# Patient Record
Sex: Female | Born: 1945 | Race: Black or African American | Hispanic: No | Marital: Single | Smoking: Never smoker
Health system: Southern US, Community
[De-identification: ages and names within clinical notes are randomized; demographics above are authoritative.]

## PROBLEM LIST (undated history)

## (undated) DIAGNOSIS — F32A Depression, unspecified: Secondary | ICD-10-CM

## (undated) DIAGNOSIS — C539 Malignant neoplasm of cervix uteri, unspecified: Secondary | ICD-10-CM

## (undated) DIAGNOSIS — I10 Essential (primary) hypertension: Secondary | ICD-10-CM

## (undated) DIAGNOSIS — E785 Hyperlipidemia, unspecified: Secondary | ICD-10-CM

## (undated) DIAGNOSIS — Z96651 Presence of right artificial knee joint: Secondary | ICD-10-CM

## (undated) DIAGNOSIS — C801 Malignant (primary) neoplasm, unspecified: Secondary | ICD-10-CM

## (undated) DIAGNOSIS — Z8541 Personal history of malignant neoplasm of cervix uteri: Secondary | ICD-10-CM

## (undated) DIAGNOSIS — F329 Major depressive disorder, single episode, unspecified: Secondary | ICD-10-CM

## (undated) DIAGNOSIS — M1990 Unspecified osteoarthritis, unspecified site: Secondary | ICD-10-CM

## (undated) DIAGNOSIS — M8580 Other specified disorders of bone density and structure, unspecified site: Secondary | ICD-10-CM

## (undated) DIAGNOSIS — E669 Obesity, unspecified: Secondary | ICD-10-CM

## (undated) DIAGNOSIS — Z96652 Presence of left artificial knee joint: Secondary | ICD-10-CM

## (undated) HISTORY — DX: Other specified disorders of bone density and structure, unspecified site: M85.80

## (undated) HISTORY — DX: Hyperlipidemia, unspecified: E78.5

## (undated) HISTORY — DX: Obesity, unspecified: E66.9

## (undated) HISTORY — DX: Personal history of malignant neoplasm of cervix uteri: Z85.41

## (undated) HISTORY — DX: Essential (primary) hypertension: I10

---
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
## 1996-07-04 HISTORY — PX: ABDOMINAL HYSTERECTOMY: SHX81

## 1996-07-04 HISTORY — PX: EYE SURGERY: SHX253

## 2003-12-29 ENCOUNTER — Emergency Department (HOSPITAL_COMMUNITY): Admission: EM | Admit: 2003-12-29 | Discharge: 2003-12-29 | Payer: Self-pay | Admitting: Family Medicine

## 2004-04-19 ENCOUNTER — Ambulatory Visit: Payer: Self-pay | Admitting: Internal Medicine

## 2004-05-03 ENCOUNTER — Ambulatory Visit: Payer: Self-pay | Admitting: Internal Medicine

## 2004-05-18 ENCOUNTER — Ambulatory Visit: Payer: Self-pay | Admitting: Internal Medicine

## 2004-06-01 ENCOUNTER — Ambulatory Visit: Payer: Self-pay | Admitting: Internal Medicine

## 2004-06-08 ENCOUNTER — Ambulatory Visit: Payer: Self-pay | Admitting: Internal Medicine

## 2004-06-22 ENCOUNTER — Ambulatory Visit: Payer: Self-pay | Admitting: Internal Medicine

## 2004-07-20 ENCOUNTER — Ambulatory Visit: Payer: Self-pay | Admitting: Internal Medicine

## 2004-07-26 ENCOUNTER — Ambulatory Visit: Payer: Self-pay | Admitting: Internal Medicine

## 2004-08-31 ENCOUNTER — Ambulatory Visit: Payer: Self-pay | Admitting: Internal Medicine

## 2004-09-07 ENCOUNTER — Ambulatory Visit: Payer: Self-pay | Admitting: Internal Medicine

## 2004-09-28 ENCOUNTER — Ambulatory Visit: Payer: Self-pay | Admitting: Internal Medicine

## 2004-11-02 ENCOUNTER — Ambulatory Visit: Payer: Self-pay | Admitting: Internal Medicine

## 2004-11-30 ENCOUNTER — Encounter: Admission: RE | Admit: 2004-11-30 | Discharge: 2004-11-30 | Payer: Self-pay | Admitting: Internal Medicine

## 2005-08-18 ENCOUNTER — Ambulatory Visit: Payer: Self-pay | Admitting: Internal Medicine

## 2005-08-29 ENCOUNTER — Ambulatory Visit: Payer: Self-pay | Admitting: Internal Medicine

## 2005-09-06 ENCOUNTER — Ambulatory Visit: Payer: Self-pay | Admitting: Internal Medicine

## 2005-09-27 ENCOUNTER — Ambulatory Visit: Payer: Self-pay | Admitting: Internal Medicine

## 2005-10-25 ENCOUNTER — Ambulatory Visit: Payer: Self-pay | Admitting: Internal Medicine

## 2006-01-13 ENCOUNTER — Ambulatory Visit: Payer: Self-pay | Admitting: Hospitalist

## 2006-02-08 ENCOUNTER — Encounter: Admission: RE | Admit: 2006-02-08 | Discharge: 2006-02-08 | Payer: Self-pay | Admitting: Internal Medicine

## 2006-02-08 ENCOUNTER — Encounter (INDEPENDENT_AMBULATORY_CARE_PROVIDER_SITE_OTHER): Payer: Self-pay | Admitting: *Deleted

## 2006-05-10 DIAGNOSIS — I10 Essential (primary) hypertension: Secondary | ICD-10-CM | POA: Insufficient documentation

## 2006-05-10 DIAGNOSIS — M8580 Other specified disorders of bone density and structure, unspecified site: Secondary | ICD-10-CM | POA: Insufficient documentation

## 2006-05-10 DIAGNOSIS — E785 Hyperlipidemia, unspecified: Secondary | ICD-10-CM | POA: Insufficient documentation

## 2006-05-10 DIAGNOSIS — Z9189 Other specified personal risk factors, not elsewhere classified: Secondary | ICD-10-CM | POA: Insufficient documentation

## 2006-05-10 DIAGNOSIS — C539 Malignant neoplasm of cervix uteri, unspecified: Secondary | ICD-10-CM | POA: Insufficient documentation

## 2006-05-10 HISTORY — DX: Malignant neoplasm of cervix uteri, unspecified: C53.9

## 2006-06-24 DIAGNOSIS — R7989 Other specified abnormal findings of blood chemistry: Secondary | ICD-10-CM | POA: Insufficient documentation

## 2006-09-04 ENCOUNTER — Telehealth: Payer: Self-pay | Admitting: *Deleted

## 2006-09-04 ENCOUNTER — Encounter: Payer: Self-pay | Admitting: *Deleted

## 2006-09-06 ENCOUNTER — Ambulatory Visit: Payer: Self-pay | Admitting: *Deleted

## 2006-09-06 ENCOUNTER — Encounter (INDEPENDENT_AMBULATORY_CARE_PROVIDER_SITE_OTHER): Payer: Self-pay | Admitting: *Deleted

## 2006-09-06 LAB — CONVERTED CEMR LAB
BUN: 12 mg/dL (ref 6–23)
CO2: 24 meq/L (ref 19–32)
Calcium: 9.4 mg/dL (ref 8.4–10.5)
Glucose, Bld: 95 mg/dL (ref 70–99)
Potassium: 4 meq/L (ref 3.5–5.3)
Sodium: 144 meq/L (ref 135–145)

## 2006-09-21 ENCOUNTER — Encounter (INDEPENDENT_AMBULATORY_CARE_PROVIDER_SITE_OTHER): Payer: Self-pay | Admitting: *Deleted

## 2006-10-13 ENCOUNTER — Ambulatory Visit: Payer: Self-pay | Admitting: Internal Medicine

## 2006-10-13 ENCOUNTER — Encounter (INDEPENDENT_AMBULATORY_CARE_PROVIDER_SITE_OTHER): Payer: Self-pay | Admitting: *Deleted

## 2006-10-16 ENCOUNTER — Encounter (INDEPENDENT_AMBULATORY_CARE_PROVIDER_SITE_OTHER): Payer: Self-pay | Admitting: *Deleted

## 2006-10-16 ENCOUNTER — Ambulatory Visit: Payer: Self-pay | Admitting: Hospitalist

## 2006-10-16 LAB — CONVERTED CEMR LAB: VLDL: 16 mg/dL (ref 0–40)

## 2007-04-19 ENCOUNTER — Telehealth: Payer: Self-pay | Admitting: *Deleted

## 2007-05-29 ENCOUNTER — Ambulatory Visit: Payer: Self-pay | Admitting: Internal Medicine

## 2007-05-29 ENCOUNTER — Encounter (INDEPENDENT_AMBULATORY_CARE_PROVIDER_SITE_OTHER): Payer: Self-pay | Admitting: *Deleted

## 2007-05-29 LAB — CONVERTED CEMR LAB
BUN: 21 mg/dL (ref 6–23)
CO2: 26 meq/L (ref 19–32)
Calcium: 9.7 mg/dL (ref 8.4–10.5)
Cholesterol: 172 mg/dL (ref 0–200)
Glucose, Bld: 93 mg/dL (ref 70–99)
HDL: 47 mg/dL (ref >39)
Potassium: 4.3 meq/L (ref 3.5–5.3)
Sodium: 139 meq/L (ref 135–145)
Total CHOL/HDL Ratio: 3.7

## 2007-07-26 ENCOUNTER — Ambulatory Visit (HOSPITAL_COMMUNITY): Admission: RE | Admit: 2007-07-26 | Discharge: 2007-07-26 | Payer: Self-pay | Admitting: Internal Medicine

## 2007-09-13 ENCOUNTER — Encounter (INDEPENDENT_AMBULATORY_CARE_PROVIDER_SITE_OTHER): Payer: Self-pay | Admitting: *Deleted

## 2007-10-16 ENCOUNTER — Encounter: Payer: Self-pay | Admitting: Family Medicine

## 2007-10-16 ENCOUNTER — Ambulatory Visit: Payer: Self-pay | Admitting: Family Medicine

## 2007-10-16 DIAGNOSIS — R87610 Atypical squamous cells of undetermined significance on cytologic smear of cervix (ASC-US): Secondary | ICD-10-CM | POA: Insufficient documentation

## 2007-10-17 LAB — HM PAP SMEAR: HM Pap smear: NEGATIVE

## 2007-10-30 ENCOUNTER — Encounter: Payer: Self-pay | Admitting: Family Medicine

## 2007-12-05 ENCOUNTER — Telehealth (INDEPENDENT_AMBULATORY_CARE_PROVIDER_SITE_OTHER): Payer: Self-pay | Admitting: *Deleted

## 2007-12-11 ENCOUNTER — Encounter (INDEPENDENT_AMBULATORY_CARE_PROVIDER_SITE_OTHER): Payer: Self-pay | Admitting: *Deleted

## 2007-12-11 ENCOUNTER — Ambulatory Visit: Payer: Self-pay | Admitting: *Deleted

## 2007-12-11 LAB — CONVERTED CEMR LAB
BUN: 18 mg/dL (ref 6–23)
Glucose, Bld: 77 mg/dL (ref 70–99)
Potassium: 3.9 meq/L (ref 3.5–5.3)

## 2008-07-09 ENCOUNTER — Telehealth (INDEPENDENT_AMBULATORY_CARE_PROVIDER_SITE_OTHER): Payer: Self-pay | Admitting: Internal Medicine

## 2008-07-16 ENCOUNTER — Ambulatory Visit (HOSPITAL_COMMUNITY): Admission: RE | Admit: 2008-07-16 | Discharge: 2008-07-16 | Payer: Self-pay | Admitting: Internal Medicine

## 2008-07-16 ENCOUNTER — Ambulatory Visit: Payer: Self-pay | Admitting: Internal Medicine

## 2008-07-16 ENCOUNTER — Encounter (INDEPENDENT_AMBULATORY_CARE_PROVIDER_SITE_OTHER): Payer: Self-pay | Admitting: Internal Medicine

## 2008-07-16 DIAGNOSIS — M25569 Pain in unspecified knee: Secondary | ICD-10-CM | POA: Insufficient documentation

## 2008-07-16 DIAGNOSIS — E669 Obesity, unspecified: Secondary | ICD-10-CM | POA: Insufficient documentation

## 2008-07-16 LAB — CONVERTED CEMR LAB
AST: 20 units/L (ref 0–37)
Albumin: 4.3 g/dL (ref 3.5–5.2)
BUN: 20 mg/dL (ref 6–23)
Calcium: 9.9 mg/dL (ref 8.4–10.5)
Cholesterol: 183 mg/dL (ref 0–200)
Glucose, Bld: 101 mg/dL — ABNORMAL HIGH (ref 70–99)
HDL: 57 mg/dL (ref >39)
LDL Cholesterol: 106 mg/dL — ABNORMAL HIGH (ref 0–99)
Sodium: 140 meq/L (ref 135–145)
Total CHOL/HDL Ratio: 3.2
Total Protein: 8.7 g/dL — ABNORMAL HIGH (ref 6.0–8.3)

## 2008-07-25 ENCOUNTER — Telehealth (INDEPENDENT_AMBULATORY_CARE_PROVIDER_SITE_OTHER): Payer: Self-pay | Admitting: Internal Medicine

## 2008-07-28 ENCOUNTER — Ambulatory Visit (HOSPITAL_COMMUNITY): Admission: RE | Admit: 2008-07-28 | Discharge: 2008-07-28 | Payer: Self-pay | Admitting: Internal Medicine

## 2009-05-11 ENCOUNTER — Ambulatory Visit: Payer: Self-pay | Admitting: Infectious Diseases

## 2009-05-11 ENCOUNTER — Observation Stay (HOSPITAL_COMMUNITY): Admission: EM | Admit: 2009-05-11 | Discharge: 2009-05-12 | Payer: Self-pay | Admitting: Emergency Medicine

## 2009-05-11 ENCOUNTER — Encounter: Payer: Self-pay | Admitting: Internal Medicine

## 2009-05-12 ENCOUNTER — Encounter: Payer: Self-pay | Admitting: Internal Medicine

## 2009-06-02 ENCOUNTER — Encounter (INDEPENDENT_AMBULATORY_CARE_PROVIDER_SITE_OTHER): Payer: Self-pay | Admitting: Internal Medicine

## 2009-06-02 ENCOUNTER — Ambulatory Visit: Payer: Self-pay | Admitting: Internal Medicine

## 2009-06-02 DIAGNOSIS — K219 Gastro-esophageal reflux disease without esophagitis: Secondary | ICD-10-CM | POA: Insufficient documentation

## 2009-07-09 ENCOUNTER — Ambulatory Visit (HOSPITAL_COMMUNITY): Admission: RE | Admit: 2009-07-09 | Discharge: 2009-07-09 | Payer: Self-pay | Admitting: Internal Medicine

## 2009-07-09 ENCOUNTER — Encounter (INDEPENDENT_AMBULATORY_CARE_PROVIDER_SITE_OTHER): Payer: Self-pay | Admitting: Internal Medicine

## 2009-08-21 ENCOUNTER — Ambulatory Visit (HOSPITAL_COMMUNITY): Admission: RE | Admit: 2009-08-21 | Discharge: 2009-08-21 | Payer: Self-pay | Admitting: Internal Medicine

## 2010-06-09 ENCOUNTER — Ambulatory Visit: Payer: Self-pay | Admitting: Internal Medicine

## 2010-06-10 ENCOUNTER — Encounter: Payer: Self-pay | Admitting: Internal Medicine

## 2010-06-10 LAB — CONVERTED CEMR LAB
AST: 22 units/L (ref 0–37)
Alkaline Phosphatase: 53 units/L (ref 39–117)
Calcium: 9.3 mg/dL (ref 8.4–10.5)
Chloride: 105 meq/L (ref 96–112)
Cholesterol: 214 mg/dL — ABNORMAL HIGH (ref 0–200)
Sodium: 140 meq/L (ref 135–145)
Total Bilirubin: 0.4 mg/dL (ref 0.3–1.2)
Total CHOL/HDL Ratio: 4
Total Protein: 7.8 g/dL (ref 6.0–8.3)

## 2010-06-15 ENCOUNTER — Telehealth: Payer: Self-pay | Admitting: Internal Medicine

## 2010-06-22 ENCOUNTER — Telehealth: Payer: Self-pay | Admitting: Internal Medicine

## 2010-06-23 ENCOUNTER — Telehealth: Payer: Self-pay | Admitting: Internal Medicine

## 2010-07-15 ENCOUNTER — Ambulatory Visit: Admission: RE | Admit: 2010-07-15 | Discharge: 2010-07-15 | Payer: Self-pay | Source: Home / Self Care

## 2010-08-05 NOTE — Progress Notes (Signed)
  Phone Note Outgoing Call   Call placed by: me Summary of Call: called patient to tell her that she needs to keep taking her statin, if anything will have to increase the dose because of her elevated LDL. Will see her in the clinic in about 3 weeks and will discuss dose adjustment. She is taking HCTZ 25mg  alone and states she doesn't have those am headaches anymore.

## 2010-08-05 NOTE — Progress Notes (Signed)
Summary: Medications  Phone Note Call from Patient   Caller: Patient Call For: Jaci Lazier MD Summary of Call: Call from pt insure as to what she should be taking.  Pt said that she did not receive any prescriptions.  Pt would like for her prescriptions to be sent to [facility].  Pt said that she is supposed to be on something for her Cholesterol.   Pt would like her medications sent to [facility] if possible. Angelina Ok RN  June 22, 2010 11:57 AM  Initial call taken by: Angelina Ok RN,  June 22, 2010 11:57 AM    Prescriptions: ZOCOR 40 MG TABS (SIMVASTATIN) Take one tablet at bedtime  #90 x 2   Entered and Authorized by:   Jaci Lazier MD   Signed by:   Jaci Lazier MD on 06/22/2010   Method used:   Faxed to ...       [facility]* (retail)       [address] [place], [place]  [address]       Ph: [phone]       Fax: [phone]   RxID:   [id number] ZOCOR 40 MG TABS (SIMVASTATIN) Take one tablet at bedtime  #90 x 2   Entered and Authorized by:   Jaci Lazier MD   Signed by:   Jaci Lazier MD on 06/22/2010   Method used:   Electronically to        [facility]* (retail)       [address] [place], [place]  [address]       Ph: [phone]       Fax: [phone]   RxID:   [id number]

## 2010-08-05 NOTE — Assessment & Plan Note (Signed)
Summary: NEED MEDICATION/SB.   Vital Signs:  Patient profile:   65 year old female Height:      64 inches (162.56 cm) Weight:      215.9 pounds (98.36 kg) BMI:     37.28 Temp:     97.0 degrees F (36.11 degrees C) oral Pulse rate:   61 / minute BP sitting:   171 / 95  (left arm) Cuff size:   regular  Vitals Entered By: Theotis Barrio NT II (July 15, 2010 11:03 AM) CC: PATIENT STATES SHE IS HERE FOR MEDICATION REFILL ONLY Is Patient Diabetic? No Pain Assessment Patient in pain? no      Nutritional Status BMI of > 30 = obese  Have you ever been in a relationship where you felt threatened, hurt or afraid?No   Does patient need assistance? Functional Status Self care Ambulation Normal   Primary Care Provider:  Jaci Lazier MD  CC:  PATIENT STATES SHE IS HERE FOR MEDICATION REFILL ONLY.  History of Present Illness: 65 y/o woman with PMH significant for HTN, HLD comes to the clinic for a follow up visit.  She is here for BP check and to get her meds refilled. Denies any new complaints today. Denies any headache, chest pain, palpitations, N/V/D.  Preventive Screening-Counseling & Management  Alcohol-Tobacco     Smoking Status: never  Caffeine-Diet-Exercise     Does Patient Exercise: yes     Type of exercise: WALKING  Problems Prior to Update: 1)  Gerd  (ICD-530.81) 2)  Obesity  (ICD-278.00) 3)  Knee Pain  (ICD-719.46) 4)  Preventive Health Care  (ICD-V70.0) 5)  S/P Supracrv Abdl Hyst +-rmvl Tube Ovary  (CPT-58180) 6)  Ascus Pap  (ICD-795.01) 7)  Hypertension  (ICD-401.9) 8)  Hyperglycemia, Borderline  (ICD-790.6) 9)  Hyperlipidemia  (ICD-272.4) 10)  Hx of Exposure To Tuberculosis  (ICD-V01.1) 11)  Hx of Cervical Cancer  (ICD-180.9) 12)  Screening, Mlig Neop, Other Breast Exm  (ICD-V76.19) 13)  Colonoscopy With Biopsy, Hx of  (ICD-V15.89) 14)  Osteopenia  (ICD-733.90)  Medications Prior to Update: 1)  Lisinopril 20 Mg Tabs (Lisinopril) .... Take Two Tablets  By Mouth Once A Day. 2)  Zocor 40 Mg Tabs (Simvastatin) .... Take One Tablet At Bedtime 3)  Tums 500 Mg Chew (Calcium Carbonate Antacid) .... Take 1 Tablet By Mouth Three Times A Day 4)  Aspir-Low 81 Mg Tbec (Aspirin) .... Take 1 Tablet By Mouth Once A Day 5)  Vitamin D 1000 Unit Tabs (Cholecalciferol) .... Take 1 Tablet By Mouth Once A Day 6)  Hydrochlorothiazide 25 Mg Tabs (Hydrochlorothiazide) .... Take One Tablet By Mouth Once Daily  Current Medications (verified): 1)  Lisinopril 20 Mg Tabs (Lisinopril) .... Take Two Tablets By Mouth Once A Day. 2)  Zocor 40 Mg Tabs (Simvastatin) .... Take One Tablet At Bedtime 3)  Tums 500 Mg Chew (Calcium Carbonate Antacid) .... Take 1 Tablet By Mouth Three Times A Day 4)  Aspir-Low 81 Mg Tbec (Aspirin) .... Take 1 Tablet By Mouth Once A Day 5)  Vitamin D 1000 Unit Tabs (Cholecalciferol) .... Take 1 Tablet By Mouth Once A Day 6)  Hydrochlorothiazide 25 Mg Tabs (Hydrochlorothiazide) .... Take One Tablet By Mouth Once Daily  Allergies (verified): No Known Drug Allergies  Past History:  Past Medical History: Last updated: 06/02/2009 Hyperlipidemia Hypertension Cerviical dysplasia Hospitalized for chest pain- r/o 11/10  Past Surgical History: Last updated: 05/10/2006 S/p Cataract surgery in 2005:[place]  Family History: Last updated: 10/13/2006  Father: died at 21 from liver Ca and cirrhosis Mother: died at 11 from a brain tumor Siblings: Brother has DM and sister died at 32 from a brain aneurysm bleed.  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    Risk Factors: Exercise: yes (07/15/2010)  Risk Factors: Smoking Status: never (07/15/2010)  Social History: Does Patient Exercise:  yes  Review of Systems  The patient denies anorexia, fever, decreased hearing, hoarseness, chest pain, syncope, dyspnea on exertion,  peripheral edema, prolonged cough, headaches, and abdominal pain.    Physical Exam  General:  alert, well-developed, well-nourished, and well-hydrated.   Head:  normocephalic, atraumatic, no abnormalities observed, and no abnormalities palpated.   Eyes:  vision grossly intact, pupils equal, pupils round, and pupils reactive to light.   Mouth:  pharynx pink and moist.   Neck:  supple, full ROM, and no masses.   Lungs:  normal respiratory effort, no intercostal retractions, no accessory muscle use, normal breath sounds, no dullness, no fremitus, no crackles, and no wheezes.   Heart:  normal rate, regular rhythm, no murmur, no gallop, no rub, and no JVD.   Abdomen:  soft, non-tender, normal bowel sounds, no distention, no masses, no guarding, no rigidity, and no rebound tenderness.   Msk:  normal ROM, no joint tenderness, no joint swelling, no joint warmth, no redness over joints, no joint deformities, and no joint instability.   Extremities:  no cyanosis, clubbing or edema. Neurologic:  alert & oriented X3, cranial nerves II-XII intact, strength normal in all extremities, sensation intact to light touch, sensation intact to pinprick, gait normal, and DTRs symmetrical and normal.     Impression & Recommendations:  Problem # 1:  HYPERTENSION (ICD-401.9) Assessment Comment Only Her BP was high but manual recheck was 144/98. She is supposed to be on two anti- hypertensive medications but she was just taking HCTZ. Denies any headaches with HCTZ.There was some confusion about her prescriptions and she never got a prescription for lisinopril. Will refill her HCTZ and lisinopril today. Her updated medication list for this problem includes:    Lisinopril 40 Mg Tabs (Lisinopril) .Marland Kitchen... Take 1 tab by mouth once daily.    Hydrochlorothiazide 25 Mg Tabs (Hydrochlorothiazide) .Marland Kitchen... Take one tablet by mouth once daily  BP today: 171/95 Prior BP: 149/93 (06/09/2010)  Labs Reviewed: K+: 3.7  (06/10/2010) Creat: : 0.86 (06/10/2010)   Chol: 214 (06/10/2010)   HDL: 53 (06/10/2010)   LDL: 136 (06/10/2010)   TG: 127 (06/10/2010)  Problem # 2:  HYPERLIPIDEMIA (ICD-272.4) Assessment: Comment Only She reports that she was not taking anything for her cholestrol for past few months and  has just started taking her statin back in December when she was seen by her PCP. Will continue her on current dose of zocor for now. Her updated medication list for this problem includes:    Zocor 40 Mg Tabs (Simvastatin) .Marland Kitchen... Take one tablet at bedtime  Problem # 3:  Preventive Health Care (ICD-V70.0) Assessment: Comment Only Her last mammogram was in Feb 2011.She got a mammogram scholarship form today.  Complete Medication List: 1)  Lisinopril 40 Mg Tabs (Lisinopril) .... Take 1 tab by mouth once daily. 2)  Zocor 40 Mg Tabs (Simvastatin) .... Take one tablet at bedtime 3)  Tums 500 Mg Chew (Calcium carbonate antacid) .... Take 1 tablet by mouth three times a day 4)  Aspir-low 81 Mg Tbec (Aspirin) .... Take  1 tablet by mouth once a day 5)  Vitamin D 1000 Unit Tabs (Cholecalciferol) .... Take 1 tablet by mouth once a day 6)  Hydrochlorothiazide 25 Mg Tabs (Hydrochlorothiazide) .... Take one tablet by mouth once daily  Patient Instructions: 1)  Please schedule a follow-up appointment in 3 months. 2)  Please take your medicines as prescribed. Prescriptions: LISINOPRIL 40 MG TABS (LISINOPRIL) take 1 tab by mouth once daily.  #90 x 1   Entered and Authorized by:   Elyse Jarvis   Signed by:   Elyse Jarvis on 07/15/2010   Method used:   Print then Give to Patient   RxID:   [id number] HYDROCHLOROTHIAZIDE 25 MG TABS (HYDROCHLOROTHIAZIDE) take one tablet by mouth once daily  #90 x 1   Entered and Authorized by:   Elyse Jarvis   Signed by:   Elyse Jarvis on 07/15/2010   Method used:   Print then Give to Patient   RxID:   [id number]    Orders Added: 1)  Est. Patient Level III  [18841]     Prevention & Chronic Care Immunizations   Influenza vaccine: Not documented   Influenza vaccine deferral: Refused  (06/02/2009)    Tetanus booster: Not documented   Td booster deferral: Refused  (06/09/2010)    Pneumococcal vaccine: Not documented    H. zoster vaccine: Not documented  Colorectal Screening   Hemoccult: Not documented    Colonoscopy: Not documented   Colonoscopy action/deferral: Refused  (06/09/2010)  Other Screening   Pap smear:  Specimen Adequacy: Satisfactory for evaluation.   Interpretation/Result:Negative for intraepithelial Lesion or Malignancy.     (09/21/2007)   Pap smear action/deferral: GYN Referral  (06/09/2010)   Pap smear due: 10/2008    Mammogram: ASSESSMENT: Negative - BI-RADS 1^MS DIGITAL SCREENING  (08/21/2009)   Mammogram due: 08/2008    DXA bone density scan: Not documented   Smoking status: never  (07/15/2010)  Lipids   Total Cholesterol: 214  (06/10/2010)   Lipid panel action/deferral: Lipid Panel ordered   LDL: 136  (06/10/2010)   LDL Direct: Not documented   HDL: 53  (06/10/2010)   Triglycerides: 127  (06/10/2010)    SGOT (AST): 22  (06/10/2010)   SGPT (ALT): 21  (06/10/2010)   Alkaline phosphatase: 53  (06/10/2010)   Total bilirubin: 0.4  (06/10/2010)  Hypertension   Last Blood Pressure: 171 / 95  (07/15/2010)   Serum creatinine: 0.86  (06/10/2010)   Serum potassium 3.7  (06/10/2010)  Self-Management Support :   Personal Goals (by the next clinic visit) :      Personal blood pressure goal: 140/90  (07/15/2010)     Personal LDL goal: 100  (07/15/2010)    Patient will work on the following items until the next clinic visit to reach self-care goals:     Medications and monitoring: take my medicines every day  (07/15/2010)     Eating: drink diet soda or water instead of juice or soda, eat more vegetables, use fresh or frozen vegetables, eat baked foods instead of fried foods, eat fruit for snacks and  desserts, limit or avoid alcohol  (07/15/2010)     Activity: take a 30 minute walk every day  (07/15/2010)    Hypertension self-management support: Resources for patients handout  (07/15/2010)    Lipid self-management support: Resources for patients handout  (07/15/2010)        Resource handout printed.

## 2010-08-05 NOTE — Assessment & Plan Note (Signed)
Summary: EST-CK/FU/MEDS/CFB   Vital Signs:  Patient profile:   65 year old female Height:      64 inches Weight:      216.4 pounds BMI:     37.28 Temp:     97.5 degrees F oral Pulse rate:   65 / minute BP sitting:   149 / 93  (right arm)  Vitals Entered By: Filomena Jungling NT II (June 09, 2010 8:28 AM) CC: Need refills/ ?about  cholesterol Is Patient Diabetic? No Pain Assessment Patient in pain? no      Nutritional Status BMI of > 30 = obese  Have you ever been in a relationship where you felt threatened, hurt or afraid?No   Does patient need assistance? Functional Status Self care Ambulation Normal   Primary Care Provider:  Jaci Lazier MD  CC:  Need refills/ ?about  cholesterol.  History of Present Illness: Pt with pmh outlined below here for f/u. She comes to the clinic only about once a year. States that she is health conscious and would like to decrease the number of medicines she is taking. She also wants to know if she can substitute fish oil for her Simvastatin.  She also complains that her blood pressure medicine is causing her to have headaches. Even though she has been on Maxzide and Lisinopril for the past 2 years, she states that she has noticed the headaches occuring right after taking her med, it lasts about 3 hours and then resolves. She states that she stopped taking them for a while after which her headaches completely resolved.  Otherwise she has no other complaints today.  Preventive Screening-Counseling & Management  Alcohol-Tobacco     Smoking Status: never  Current Problems (verified): 1)  Screening For Malignant Neoplasm of The Cervix  (ICD-V76.2) 2)  Gerd  (ICD-530.81) 3)  Obesity  (ICD-278.00) 4)  Knee Pain  (ICD-719.46) 5)  Preventive Health Care  (ICD-V70.0) 6)  S/P Supracrv Abdl Hyst +-rmvl Tube Ovary  (CPT-58180) 7)  Ascus Pap  (ICD-795.01) 8)  Hypertension  (ICD-401.9) 9)  Hyperglycemia, Borderline  (ICD-790.6) 10)  Hyperlipidemia   (ICD-272.4) 11)  Hx of Exposure To Tuberculosis  (ICD-V01.1) 12)  Hx of Cervical Cancer  (ICD-180.9) 13)  Screening, Mlig Neop, Other Breast Exm  (ICD-V76.19) 14)  Colonoscopy With Biopsy, Hx of  (ICD-V15.89) 15)  Osteopenia  (ICD-733.90)  Current Medications (verified): 1)  Lisinopril 20 Mg Tabs (Lisinopril) .... Take Two Tablets By Mouth Once A Day. 2)  Zocor 40 Mg Tabs (Simvastatin) .... Take One Tablet At Bedtime 3)  Tums 500 Mg Chew (Calcium Carbonate Antacid) .... Take 1 Tablet By Mouth Three Times A Day 4)  Aspir-Low 81 Mg Tbec (Aspirin) .... Take 1 Tablet By Mouth Once A Day 5)  Vitamin D 1000 Unit Tabs (Cholecalciferol) .... Take 1 Tablet By Mouth Once A Day 6)  Amlodipine Besylate 10 Mg Tabs (Amlodipine Besylate) .... Take One Tablet By Mouth Once Daily  Allergies (verified): No Known Drug Allergies  Past History:  Past Medical History: Last updated: 06/02/2009 Hyperlipidemia Hypertension Cerviical dysplasia Hospitalized for chest pain- r/o 11/10  Past Surgical History: Last updated: 05/10/2006 S/p Cataract surgery in 2005:[place]  Family History: Last updated: 11-09-2006 Father: died at 66 from liver Ca and cirrhosis Mother: died at 96 from a brain tumor Siblings: Brother has DM and sister died at 37 from a brain aneurysm bleed.  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    Risk Factors: Smoking Status: never (06/09/2010)  Physical Exam  General:  Well-developed,well-nourished,in no acute distress; alert,appropriate and cooperative throughout examination Head:  normocephalic and atraumatic.   Eyes:  vision grossly intact.   Ears:  no external deformities.   Nose:  no external deformity and no nasal discharge.   Neck:  supple and full ROM.   Lungs:  normal respiratory effort, normal breath sounds, no crackles, and no wheezes.   Heart:   normal rate, regular rhythm, no murmur, and no gallop.   Abdomen:  soft, non-tender, normal bowel sounds, and no masses.   Pulses:  normal dp/pt pulses bilaterally Extremities:  no edema or cyanosis noted Neurologic:  alert & oriented X3 and gait normal.   Skin:  color normal.   Psych:  normally interactive, not anxious appearing, and not depressed appearing.     Impression & Recommendations:  Problem # 1:  HYPERTENSION (ICD-401.9) Did not take her med this morning, however, bp not terrible. It seems that the Maxzide may have been causing her headache (triamterene and hctz individually have been associated with headaches). WIll try switching to Amlodipine and have her try that for one month, then she is to come back for a bp recheck and so we can assess if she's still having headaches. Will check CMP today.  The following medications were removed from the medication list:    Maxzide-25 37.5-25 Mg Tabs (Triamterene-hctz) .Marland Kitchen... Take 1 tablet by mouth once a day Her updated medication list for this problem includes:    Lisinopril 20 Mg Tabs (Lisinopril) .Marland Kitchen... Take two tablets by mouth once a day.    Amlodipine Besylate 10 Mg Tabs (Amlodipine besylate) .Marland Kitchen... Take one tablet by mouth once daily  Orders: T-Comprehensive Metabolic Panel ([id number])  BP today: 149/93 Prior BP: 136/84 (06/02/2009)  Labs Reviewed: K+: 4.4 (07/16/2008) Creat: : 0.90 (07/16/2008)   Chol: 183 (07/16/2008)   HDL: 57 (07/16/2008)   LDL: 106 (07/16/2008)   TG: 101 (07/16/2008)  Problem # 2:  HYPERLIPIDEMIA (ICD-272.4) She wants to stop taking her statin and instead take fish oil. I discouraged this and told her that I'd like to check her lipid profile first. I told her I will contact her with results and discuss the plan at that time bearing in mind that there is no scientific evidence backing the use of fish oil as opposed to a statin in preventing cardiovascular disease.  Her updated medication list for this  problem includes:    Zocor 40 Mg Tabs (Simvastatin) .Marland Kitchen... Take one tablet at bedtime  Orders: T-Lipid Profile ([id number])  Problem # 3:  Preventive Health Care (ICD-V70.0) She normally gets her GYN exams over at the [facility] hospital and states that she'd prefer to be seen there and get her pap smear there because she is now due. Declined flu shot. Mammogram is up to date. States she's done a colonoscopy within the past 10 years, will do a chart review so I can update her records.  Orders: Gynecologic Referral (Gyn)  Complete Medication List: 1)  Lisinopril 20 Mg Tabs (Lisinopril) .... Take two tablets by mouth once a day. 2)  Zocor 40 Mg Tabs (Simvastatin) .... Take one tablet at bedtime 3)  Tums 500 Mg Chew (Calcium carbonate antacid) .... Take 1 tablet by mouth three times a day 4)  Aspir-low 81 Mg Tbec (Aspirin) .... Take 1 tablet by mouth once a day 5)  Vitamin D 1000 Unit  Tabs (Cholecalciferol) .... Take 1 tablet by mouth once a day 6)  Amlodipine Besylate 10 Mg Tabs (Amlodipine besylate) .... Take one tablet by mouth once daily  Patient Instructions: 1)  Stop taking Maxzide. You have been given a prescription for Amlodipine instead. Take for one month and return to the clinic so we can see if your headaches are better. 2)  You will be contacted with your lab results. 3)  Make an appointment with Dr. Narda Bonds. 4)  Please schedule a follow-up appointment in 1 month. Prescriptions: ASPIR-LOW 81 MG TBEC (ASPIRIN) Take 1 tablet by mouth once a day  #31 x 11   Entered and Authorized by:   Jaci Lazier MD   Signed by:   Jaci Lazier MD on 06/09/2010   Method used:   Print then Give to Patient   RxID:   [id number] LISINOPRIL 20 MG TABS (LISINOPRIL) Take two tablets by mouth once a day.  #60 x 6   Entered and Authorized by:   Jaci Lazier MD   Signed by:   Jaci Lazier MD on 06/09/2010   Method used:   Print then Give to Patient   RxID:   [id number] AMLODIPINE BESYLATE  10 MG TABS (AMLODIPINE BESYLATE) take one tablet by mouth once daily  #30 x 0   Entered and Authorized by:   Jaci Lazier MD   Signed by:   Jaci Lazier MD on 06/09/2010   Method used:   Print then Give to Patient   RxID:   [id number]    Orders Added: 1)  Gynecologic Referral [Gyn] 2)  T-Lipid Profile [[id number]] 3)  T-Comprehensive Metabolic Panel [[id number]] 4)  Est. Patient Level IV [44034]    Prevention & Chronic Care Immunizations   Influenza vaccine: Not documented   Influenza vaccine deferral: Refused  (06/02/2009)    Tetanus booster: Not documented   Td booster deferral: Refused  (06/09/2010)    Pneumococcal vaccine: Not documented    H. zoster vaccine: Not documented  Colorectal Screening   Hemoccult: Not documented    Colonoscopy: Not documented   Colonoscopy action/deferral: Refused  (06/09/2010)  Other Screening   Pap smear:  Specimen Adequacy: Satisfactory for evaluation.   Interpretation/Result:Negative for intraepithelial Lesion or Malignancy.     (09/21/2007)   Pap smear action/deferral: GYN Referral  (06/09/2010)   Pap smear due: 10/2008    Mammogram: ASSESSMENT: Negative - BI-RADS 1^MS DIGITAL SCREENING  (08/21/2009)   Mammogram due: 08/2008    DXA bone density scan: Not documented   Smoking status: never  (06/09/2010)  Lipids   Total Cholesterol: 183  (07/16/2008)   Lipid panel action/deferral: Lipid Panel ordered   LDL: 106  (07/16/2008)   LDL Direct: Not documented   HDL: 57  (07/16/2008)   Triglycerides: 101  (07/16/2008)    SGOT (AST): 20  (07/16/2008)   SGPT (ALT): 18  (07/16/2008) CMP ordered    Alkaline phosphatase: 55  (07/16/2008)   Total bilirubin: 0.7  (07/16/2008)    Lipid flowsheet reviewed?: Yes  Hypertension   Last Blood Pressure: 149 / 93  (06/09/2010)   Serum creatinine: 0.90  (07/16/2008)   Serum potassium 4.4  (07/16/2008) CMP ordered     Hypertension flowsheet reviewed?: Yes   Progress toward BP  goal: Deteriorated  Self-Management Support :    Patient will work on the following items until the next clinic visit to reach self-care goals:     Medications and monitoring: take my medicines every day, bring all of  my medications to every visit  (06/09/2010)     Eating: eat more vegetables, use fresh or frozen vegetables, eat foods that are low in salt, eat baked foods instead of fried foods, eat fruit for snacks and desserts  (06/09/2010)     Activity: take the stairs instead of the elevator  (06/09/2010)    Hypertension self-management support: Education handout, Resources for patients handout  (06/09/2010)   Hypertension education handout printed    Lipid self-management support: Education handout, Resources for patients handout  (06/09/2010)     Lipid education handout printed      Resource handout printed.   Nursing Instructions: Gyn referral for screening Pap (see order)    Process Orders Check Orders Results:     [facility]: ABN not required for this insurance Tests Sent for requisitioning (June 09, 2010 12:07 PM):     06/09/2010: [facility] -- T-Lipid Profile [phone] (signed)     06/09/2010: [facility] -- T-Comprehensive Metabolic Panel [phone] (signed)     Appended Document: EST-CK/FU/MEDS/CFB pt called in to say she can't afford amlodipine. Will have her try HCTZ 25mg  once daily instead, to see her back in clinic in one month.

## 2010-08-05 NOTE — Progress Notes (Signed)
Summary: Medications  Phone Note Call from Patient   Caller: Patient Call For: Jaci Lazier MD Summary of Call: Call from pt pt is o go to [facility] to pick up her prescription for the Zocor only and to return in 3 weeks for lab work.  Pt has already pick up the HCTZ.  Pt to call if further problems. Angelina Ok RN  June 23, 2010 10:59 AM  Initial call taken by: Angelina Ok RN,  June 23, 2010 10:59 AM  Follow-up for Phone Call        thanks!

## 2010-09-02 ENCOUNTER — Encounter: Payer: Self-pay | Admitting: Internal Medicine

## 2010-10-06 LAB — CBC
HCT: 36.6 % (ref 36.0–46.0)
Hemoglobin: 12.7 g/dL (ref 12.0–15.0)
MCHC: 34.8 g/dL (ref 30.0–36.0)
MCV: 87.1 fL (ref 78.0–100.0)
Platelets: 209 10*3/uL (ref 150–400)
RDW: 13.3 % (ref 11.5–15.5)
WBC: 6.1 10*3/uL (ref 4.0–10.5)

## 2010-10-06 LAB — CARDIAC PANEL(CRET KIN+CKTOT+MB+TROPI)
CK, MB: 1.3 ng/mL (ref 0.3–4.0)
CK, MB: 1.5 ng/mL (ref 0.3–4.0)
Total CK: 78 U/L (ref 7–177)

## 2010-10-06 LAB — TROPONIN I: Troponin I: 0.01 ng/mL (ref 0.00–0.06)

## 2010-10-06 LAB — DIFFERENTIAL
Basophils Absolute: 0 10*3/uL (ref 0.0–0.1)
Basophils Relative: 0 % (ref 0–1)
Eosinophils Absolute: 0.1 10*3/uL (ref 0.0–0.7)
Neutro Abs: 3.6 10*3/uL (ref 1.7–7.7)
Neutrophils Relative %: 60 % (ref 43–77)

## 2010-10-06 LAB — BASIC METABOLIC PANEL
CO2: 30 mEq/L (ref 19–32)
Calcium: 9.6 mg/dL (ref 8.4–10.5)
Chloride: 101 mEq/L (ref 96–112)
GFR calc Af Amer: >60 mL/min (ref >60)
Potassium: 3.5 mEq/L (ref 3.5–5.1)
Sodium: 138 mEq/L (ref 135–145)

## 2010-10-06 LAB — LIPID PANEL
Cholesterol: 139 mg/dL (ref 0–200)
HDL: 43 mg/dL (ref >39)
Triglycerides: 65 mg/dL (ref <150)

## 2010-10-06 LAB — BRAIN NATRIURETIC PEPTIDE: Pro B Natriuretic peptide (BNP): 42 pg/mL (ref 0.0–100.0)

## 2010-10-06 LAB — POCT CARDIAC MARKERS
CKMB, poc: 1.7 ng/mL (ref 1.0–8.0)
Myoglobin, poc: 127 ng/mL (ref 12–200)
Troponin i, poc: <0.05 ng/mL (ref 0.00–0.09)

## 2010-10-18 ENCOUNTER — Other Ambulatory Visit: Payer: Self-pay | Admitting: Internal Medicine

## 2010-10-18 DIAGNOSIS — Z1231 Encounter for screening mammogram for malignant neoplasm of breast: Secondary | ICD-10-CM

## 2010-10-22 ENCOUNTER — Ambulatory Visit (HOSPITAL_COMMUNITY)
Admission: RE | Admit: 2010-10-22 | Discharge: 2010-10-22 | Disposition: A | Discharge status: Home / Self Care | Payer: Self-pay | Source: Ambulatory Visit | Attending: Internal Medicine | Admitting: Internal Medicine

## 2010-10-22 DIAGNOSIS — Z1231 Encounter for screening mammogram for malignant neoplasm of breast: Secondary | ICD-10-CM

## 2011-01-25 ENCOUNTER — Other Ambulatory Visit: Payer: Self-pay | Admitting: Internal Medicine

## 2011-01-25 NOTE — Telephone Encounter (Signed)
Message left for pt to call the clinic for an appt.  Also, message sent to front desk.

## 2011-01-25 NOTE — Telephone Encounter (Signed)
I cannot tell when she was last seen and whether she has a f/u app't.

## 2011-01-25 NOTE — Telephone Encounter (Signed)
Please make her an appt

## 2011-02-22 ENCOUNTER — Encounter: Payer: Self-pay | Admitting: Internal Medicine

## 2011-02-24 ENCOUNTER — Encounter: Payer: Self-pay | Admitting: Internal Medicine

## 2011-02-24 ENCOUNTER — Ambulatory Visit (INDEPENDENT_AMBULATORY_CARE_PROVIDER_SITE_OTHER): Payer: Self-pay | Admitting: Internal Medicine

## 2011-02-24 DIAGNOSIS — E785 Hyperlipidemia, unspecified: Secondary | ICD-10-CM

## 2011-02-24 DIAGNOSIS — R7989 Other specified abnormal findings of blood chemistry: Secondary | ICD-10-CM

## 2011-02-24 DIAGNOSIS — I10 Essential (primary) hypertension: Secondary | ICD-10-CM

## 2011-02-24 DIAGNOSIS — K219 Gastro-esophageal reflux disease without esophagitis: Secondary | ICD-10-CM

## 2011-02-24 DIAGNOSIS — R7309 Other abnormal glucose: Secondary | ICD-10-CM

## 2011-02-24 LAB — LIPID PANEL
Cholesterol: 159 mg/dL (ref 0–200)
Total CHOL/HDL Ratio: 3.1 Ratio

## 2011-02-24 MED ORDER — SIMVASTATIN 40 MG PO TABS: 40.0000 mg | ORAL_TABLET | Freq: Every day | ORAL | Status: DC

## 2011-02-24 MED ORDER — AMLODIPINE BESYLATE 10 MG PO TABS: 10.0000 mg | ORAL_TABLET | Freq: Every day | ORAL | Status: DC

## 2011-02-24 MED ORDER — LISINOPRIL 20 MG PO TABS: 40.0000 mg | ORAL_TABLET | Freq: Every day | ORAL | Status: DC

## 2011-02-24 MED ORDER — HYDROCHLOROTHIAZIDE 25 MG PO TABS: 25.0000 mg | ORAL_TABLET | Freq: Every day | ORAL | Status: DC

## 2011-02-24 NOTE — Assessment & Plan Note (Signed)
Patient states that she checks her blood sugar occasionally. We will check her blood sugar on today's basic metabolic panel. Not currently medicated. Working on lifestyle modification to decrease future risk of diabetes.

## 2011-02-24 NOTE — Progress Notes (Signed)
I agree with assessment and plan as per Dr. Kollar. 

## 2011-02-24 NOTE — Assessment & Plan Note (Signed)
Patient currently taking 40 mg of lisinopril daily and 25 mg of hydrochlorothiazide daily. We did add amlodipine 10 mg daily at today's visit. Her blood pressure was 173/99 when she came in, and did decrease to 169/100 after she had been sitting for a while. We did feel the need to add  a medication at this time. We did advise her of this and she is okay with this plan. She will come back in one month for a checkup of her blood pressure. We did check a basic metabolic panel at today's visit to check on kidney function. I did also advise her of lifestyle modifications such as diet and increasing exercise that may help lower her blood pressure slightly.

## 2011-02-24 NOTE — Assessment & Plan Note (Signed)
Patient is taking Zocor 40 mg daily. We will check a fasting lipid panel at today's visit. No changes at this time.

## 2011-02-24 NOTE — Patient Instructions (Addendum)
You were seen today by Dr. Dorise Hiss for a check up and refills of your medicines. Please increase your level of activity. Exercise can help to naturally lower your blood pressure. Your blood pressure is a little high today. Remember to take your medicines every day. We are adding amlodipine (norvasc) 10 mg daily. You will come back in 1 month to meet your regular doctor, Dr. Clyde Lundborg and check your blood pressure. If you have any questions or problems or would like to be seen sooner please feel free to call our office. Our number is [phone].   Hypertension Information As your heart beats, it forces blood through your arteries. This force is your blood pressure. If the pressure is too high, it is called hypertension (HTN) or high blood pressure. HTN is dangerous because you may have it and not know it. High blood pressure may mean that your heart has to work harder to pump blood. Your arteries may be narrow or stiff. The extra work puts you at risk for heart disease, stroke, and other problems.  Blood pressure consists of two numbers, a higher number over a lower, 110/72, for example. It is stated as "110 over 72." The ideal is below 120 for the top number (systolic) and under 80 for the bottom (diastolic).  You should pay close attention to your blood pressure if you have certain conditions such as:  Heart failure.  Prior heart attack.   Diabetes   Chronic kidney disease.   Prior stroke.   Multiple risk factors for heart disease.   To see if you have HTN, your blood pressure should be measured while you are seated with your arm held at the level of the heart. It should be measured at least twice. A one-time elevated blood pressure reading (especially in the Emergency Department) does not mean that you need treatment. There may be conditions in which the blood pressure is different between your right and left arms. It is important to see your caregiver soon for a recheck. Most people have essential  hypertension which means that there is not a specific cause. This type of high blood pressure may be lowered by changing lifestyle factors such as:  Stress.  Smoking.   Lack of exercise.   Excessive weight.  Drug/tobacco/alcohol use.   Eating less salt.   Most people do not have symptoms from high blood pressure until it has caused damage to the body. Effective treatment can often prevent, delay or reduce that damage. TREATMENT Treatment for high blood pressure, when a cause has been identified, is directed at the cause. There are a large number of medications to treat HTN. These fall into several categories, and your caregiver will help you select the medicines that are best for you. Medications may have side effects. You should review side effects with your caregiver. If your blood pressure stays high after you have made lifestyle changes or started on medicines,   Your medication(s) may need to be changed.   Other problems may need to be addressed.   Be certain you understand your prescriptions, and know how and when to take your medicine.   Be sure to follow up with your caregiver within the time frame advised (usually within two weeks) to have your blood pressure rechecked and to review your medications.   If you are taking more than one medicine to lower your blood pressure, make sure you know how and at what times they should be taken. Taking two medicines at the  same time can result in blood pressure that is too low.  Document Released: 08/23/2005 Document Re-Released: 09/14/2009 [facility] Patient Information 2011 [place], [place].

## 2011-02-24 NOTE — Progress Notes (Signed)
  Subjective:    Patient ID: Sherri Delgado, female    DOB: Jun 15, 1946, 65 y.o.   MRN: [id number]  HPI: Patient is a 65 year old female who is seen today because she was told to come in as she would not be given any more refills until she did. She is not having any acute issues at today's visit. She states that she has been taking her medications every day as ordered. She has gotten a Pap every year per patient. I looked back through records and was only able to find one done in 2010. She states that she has gotten them yearly. She is nonsmoker and has never been a smoker. She has got a mammogram recently. Her acid reflux is well-controlled off medication. She denies any chest pain, shortness of breath, pain anywhere, abdominal discomfort, constipation, diarrhea.    Review of Systems  Constitutional: Negative for fever, chills, diaphoresis, activity change, appetite change, fatigue and unexpected weight change.  HENT: Negative.   Eyes: Negative.   Respiratory: Negative for cough, choking, chest tightness, shortness of breath, wheezing and stridor.   Cardiovascular: Negative for chest pain, palpitations and leg swelling.  Gastrointestinal: Negative for nausea, vomiting, abdominal pain, diarrhea, constipation and abdominal distention.  Genitourinary: Negative for dysuria, urgency, frequency, flank pain, enuresis and difficulty urinating.  Musculoskeletal: Negative for myalgias, back pain, joint swelling, arthralgias and gait problem.  Skin: Negative for color change, pallor, rash and wound.  Neurological: Negative for dizziness, tremors, seizures, syncope, speech difficulty, weakness, light-headedness, numbness and headaches.  Hematological: Negative.   Psychiatric/Behavioral: Negative.     Vitals: Blood pressure: 173/99 Temperature: 98.80F Pulse: 66 Recheck blood pressure: 169/100 Weight 216 pounds:    Objective:   Physical Exam  Constitutional: She is oriented to person, place, and  time. She appears well-developed and well-nourished.  HENT:  Head: Normocephalic and atraumatic.  Eyes: EOM are normal. Pupils are equal, round, and reactive to light.  Neck: Normal range of motion. Neck supple. No tracheal deviation present. No thyromegaly present.  Cardiovascular: Normal rate, regular rhythm and normal heart sounds.   Pulmonary/Chest: Effort normal and breath sounds normal. No respiratory distress. She has no wheezes. She has no rales. She exhibits no tenderness.  Abdominal: Soft. Bowel sounds are normal. She exhibits no distension and no mass. There is no tenderness. There is no rebound and no guarding.  Musculoskeletal: Normal range of motion. She exhibits no edema and no tenderness.  Lymphadenopathy:    She has no cervical adenopathy.  Neurological: She is alert and oriented to person, place, and time. No cranial nerve deficit.  Skin: Skin is warm and dry. No rash noted. No erythema. No pallor.  Psychiatric: She has a normal mood and affect. Her behavior is normal. Judgment and thought content normal.         Assessment & Plan:   1. Please see problem oriented charting.  2. Disposition-patient will be seen back in one month as we have added a new blood pressure medication today. We did add amlodipine 10 mg daily. If possible patient should be seen by Dr. Clyde Lundborg so that she can meet her regular doctor. I did ask her to work on her diet and exercise to help decrease her blood pressure even further. She is a nonsmoker. We did draw a basic metabolic panel and fasting lipid panel at today's visit. I will call her if the results are abnormal.

## 2011-02-24 NOTE — Assessment & Plan Note (Signed)
Well-controlled off medication. 

## 2011-02-25 LAB — BASIC METABOLIC PANEL WITH GFR
Calcium: 10 mg/dL (ref 8.4–10.5)
Chloride: 101 mEq/L (ref 96–112)
Creat: 0.71 mg/dL (ref 0.50–1.10)
GFR, Est Non African American: >60 mL/min (ref >60)

## 2011-03-15 ENCOUNTER — Encounter: Payer: Self-pay | Admitting: Internal Medicine

## 2011-03-15 ENCOUNTER — Ambulatory Visit (INDEPENDENT_AMBULATORY_CARE_PROVIDER_SITE_OTHER): Payer: Self-pay | Admitting: Internal Medicine

## 2011-03-15 DIAGNOSIS — I10 Essential (primary) hypertension: Secondary | ICD-10-CM

## 2011-03-15 DIAGNOSIS — C539 Malignant neoplasm of cervix uteri, unspecified: Secondary | ICD-10-CM

## 2011-03-15 DIAGNOSIS — E785 Hyperlipidemia, unspecified: Secondary | ICD-10-CM

## 2011-03-15 DIAGNOSIS — M25569 Pain in unspecified knee: Secondary | ICD-10-CM

## 2011-03-15 MED ORDER — AMLODIPINE BESYLATE 10 MG PO TABS: 10.0000 mg | ORAL_TABLET | Freq: Every day | ORAL | Status: DC

## 2011-03-15 MED ORDER — LISINOPRIL-HYDROCHLOROTHIAZIDE 20-12.5 MG PO TABS: 2.0000 | ORAL_TABLET | Freq: Every day | ORAL | Status: DC

## 2011-03-15 NOTE — Patient Instructions (Signed)
I I give you a new pill which combines your two medications into one pill. The new pill is Prinzide which combines Lisinopril and Hydrochlorothiazide into one pill. You need to take 2 tablets of this new pill daily.

## 2011-03-15 NOTE — Assessment & Plan Note (Signed)
Her hypertension is well controlled. Bp is 124/78 today. She wants me to simplify her pills because she does not want to take too much medication. I ordered Prinzid which combines HCTZ and Lisinopril as one pill at some dose that she has been taking. Will follow up.

## 2011-03-15 NOTE — Assessment & Plan Note (Signed)
This problem has been stable. She does not have knee pain recently. Will follow up.

## 2011-03-15 NOTE — Progress Notes (Signed)
  Subjective:    Patient ID: Sherri Delgado, female    DOB: March 01, 1946, 65 y.o.   MRN: [id number]  HPI Patient is a 65 year old female with PMH of HTN, HLD, GERD, who presents for a follow up visit. She does not have any complaints and has no any acute issues at today's visit. She states that she has been taking her home medications regularly. She had a normal Pap smear on January 26, 2011, normal mammography on October 22, 2010. She would like to consider colonoscopy in next visit because of no insurance. Her lipid panel and BMP done on Feb 24, 2011 were all normal. She denies any chest pain, shortness of breath, abdominal discomfort, constipation or diarrhea.   Review of Systems Constitutional: Negative for fever, chills, diaphoresis, activity change, appetite change, fatigue and unexpected weight change.  HENT: Negative.  Eyes: Negative.  Respiratory: Negative for cough, choking, chest tightness, shortness of breath, wheezing and stridor.  Cardiovascular: Negative for chest pain, palpitations and leg swelling.  Gastrointestinal: Negative for nausea, vomiting, abdominal pain, diarrhea, constipation and abdominal distention.  Genitourinary: Negative for dysuria, urgency, frequency, flank pain, enuresis and difficulty urinating.  Musculoskeletal: Negative for myalgias, back pain, joint swelling, arthralgias and gait problem.  Skin: Negative for color change, pallor, rash and wound.  Neurological: Negative for dizziness, tremors, seizures, syncope, speech difficulty, weakness, light-headedness, numbness and headaches.  Hematological: Negative.  Psychiatric/Behavioral: Negative     Objective:   Physical Exam General: alert, well-developed, and cooperative to examination.  Head: normocephalic and atraumatic.  Eyes: vision grossly intact, pupils equal, pupils round, pupils reactive to light, no injection and anicteric.  Mouth: pharynx pink and moist, no erythema, and no exudates.  Neck: supple, full  ROM, no thyromegaly, no JVD, and no carotid bruits.  Lungs: normal respiratory effort, no accessory muscle use, normal breath sounds, no crackles, and no wheezes. Heart: normal rate, regular rhythm, no murmur, no gallop, and no rub.  Abdomen: soft, non-tender, normal bowel sounds, no distention, no guarding, no rebound tenderness, no hepatomegaly, and no splenomegaly.  Msk: no joint swelling, no joint warmth, and no redness over joints.  Pulses: 2+ DP/PT pulses bilaterally Extremities: No cyanosis, clubbing, edema Neurologic: alert & oriented X3, cranial nerves II-XII intact, strength normal in all extremities, sensation intact to light touch, and gait normal.  Skin: turgor normal and no rashes.  Psych: Oriented X3, memory intact for recent and remote, normally interactive, good eye contact, not anxious appearing, and not depressed appearing.         Assessment & Plan:

## 2011-03-15 NOTE — Assessment & Plan Note (Addendum)
Her HLD is well controlled by Zocor. Her lipid panel was normal on Feb 24, 2011. She does not have muscle pain or other side effects. Will continue with current regimen.

## 2011-03-15 NOTE — Assessment & Plan Note (Signed)
She had a normal Pap smear on Jun. 25, 2012. She does not have any vaginal bleeding or other related symptoms. Will follow up.

## 2011-03-16 NOTE — Progress Notes (Signed)
agree with plans and notes 

## 2011-07-05 HISTORY — PX: COLONOSCOPY: SHX174

## 2011-11-08 ENCOUNTER — Encounter: Payer: Self-pay | Admitting: Internal Medicine

## 2011-11-08 ENCOUNTER — Ambulatory Visit (INDEPENDENT_AMBULATORY_CARE_PROVIDER_SITE_OTHER): Payer: Medicare Other | Admitting: Internal Medicine

## 2011-11-08 VITALS — BP 130/79 | HR 66 | Temp 97.9°F | Wt 221.0 lb

## 2011-11-08 DIAGNOSIS — R519 Headache, unspecified: Secondary | ICD-10-CM | POA: Insufficient documentation

## 2011-11-08 DIAGNOSIS — Z23 Encounter for immunization: Secondary | ICD-10-CM

## 2011-11-08 DIAGNOSIS — I10 Essential (primary) hypertension: Secondary | ICD-10-CM

## 2011-11-08 DIAGNOSIS — C539 Malignant neoplasm of cervix uteri, unspecified: Secondary | ICD-10-CM

## 2011-11-08 DIAGNOSIS — R51 Headache: Secondary | ICD-10-CM

## 2011-11-08 DIAGNOSIS — Z9189 Other specified personal risk factors, not elsewhere classified: Secondary | ICD-10-CM

## 2011-11-08 DIAGNOSIS — Z1231 Encounter for screening mammogram for malignant neoplasm of breast: Secondary | ICD-10-CM

## 2011-11-08 DIAGNOSIS — Z1211 Encounter for screening for malignant neoplasm of colon: Secondary | ICD-10-CM

## 2011-11-08 DIAGNOSIS — R7309 Other abnormal glucose: Secondary | ICD-10-CM

## 2011-11-08 DIAGNOSIS — Z0000 Encounter for general adult medical examination without abnormal findings: Secondary | ICD-10-CM

## 2011-11-08 DIAGNOSIS — Z7251 High risk heterosexual behavior: Secondary | ICD-10-CM

## 2011-11-08 DIAGNOSIS — Z1239 Encounter for other screening for malignant neoplasm of breast: Secondary | ICD-10-CM

## 2011-11-08 DIAGNOSIS — E785 Hyperlipidemia, unspecified: Secondary | ICD-10-CM

## 2011-11-08 DIAGNOSIS — R002 Palpitations: Secondary | ICD-10-CM

## 2011-11-08 DIAGNOSIS — M25569 Pain in unspecified knee: Secondary | ICD-10-CM

## 2011-11-08 DIAGNOSIS — R7989 Other specified abnormal findings of blood chemistry: Secondary | ICD-10-CM

## 2011-11-08 LAB — BASIC METABOLIC PANEL
BUN: 22 mg/dL (ref 6–23)
Chloride: 101 mEq/L (ref 96–112)
Glucose, Bld: 94 mg/dL (ref 70–99)
Potassium: 4.2 mEq/L (ref 3.5–5.3)

## 2011-11-08 MED ORDER — LISINOPRIL 40 MG PO TABS: 40.0000 mg | ORAL_TABLET | Freq: Every day | ORAL | Status: DC

## 2011-11-08 MED ORDER — SIMVASTATIN 40 MG PO TABS: 40.0000 mg | ORAL_TABLET | Freq: Every day | ORAL | Status: DC

## 2011-11-08 MED ORDER — ZOSTER VACCINE LIVE 19400 UNT/0.65ML ~~LOC~~ SOLR: 0.6500 mL | Freq: Once | SUBCUTANEOUS | Status: AC

## 2011-11-08 MED ORDER — HYDROCHLOROTHIAZIDE 25 MG PO TABS: 25.0000 mg | ORAL_TABLET | Freq: Every day | ORAL | Status: DC

## 2011-11-08 MED ORDER — AMLODIPINE BESYLATE 10 MG PO TABS: 10.0000 mg | ORAL_TABLET | Freq: Every day | ORAL | Status: DC

## 2011-11-08 NOTE — Assessment & Plan Note (Signed)
Referral to [facility] for colonoscopy.

## 2011-11-08 NOTE — Progress Notes (Signed)
Subjective:   Patient ID: Sherri Delgado female   DOB: July 07, 1945 66 y.o.   MRN: [id number]  HPI: [name] is a 66 y.o. woman with past medical history significant for hypertension, hyperlipidemia, GERD, history of cervical cancer, and osteopenia. She returns today for annual followup.  She complains of a diffuse nagging headache for one month, it is intermittent, and aggravating. At present her headache is bifrontal. Headache tends to be 3/10 in pain severity. No alleviating or exacerbating factors. She has not tried any medication over-the-counter. She has not noticed a pattern associated with her headaches. The last time she had a headache similar to this, there was an adjustment in her antihypertensive medications. She has recently noticed vision changes, as she is having increased difficulty with distance. She wears bifocals at baseline, and has not updated her classes in 3 years.  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 She is not interested in any medications at this time. She has named her arthritis "Sherri Delgado," as he is the only man that sticks around. Apparently she has history of a Baker's cyst behind her right knee.  She also tells me that she uses 2 Benadryl for sleep. She has done this for the last 8 years, which is when she moved to [place] from the [place] area.  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  Finally she tells me about intermittent heart palpitations without chest pain. She has no history of thyroid disease in the family. She is not having any heart palpitations at this time. She cannot identify a pattern with which palpitations occur. She does not believe that this is stress related.  She is very proactive, especially because she has history of cervical cancer and she is here today to update all of her annual health screening.   Current Outpatient Prescriptions    Medication Sig Dispense Refill  . amLODipine (NORVASC) 10 MG tablet Take 1 tablet (10 mg total) by mouth daily.  90 tablet  4  . aspirin 81 MG tablet Take 81 mg by mouth daily.        . diphenhydrAMINE (SOMINEX) 25 MG tablet Take 50 mg by mouth at bedtime as needed. Patient uses this for sleep      . hydrochlorothiazide (HYDRODIURIL) 25 MG tablet Take 1 tablet (25 mg total) by mouth daily.  90 tablet  4  . lisinopril (PRINIVIL,ZESTRIL) 40 MG tablet Take 1 tablet (40 mg total) by mouth daily.  90 tablet  4  . simvastatin (ZOCOR) 40 MG tablet Take 1 tablet (40 mg total) by mouth at bedtime.  90 tablet  4  . DISCONTD: amLODipine (NORVASC) 10 MG tablet Take 1 tablet (10 mg total) by mouth daily.  30 tablet  6  . DISCONTD: hydrochlorothiazide 25 MG tablet Take 1 tablet (25 mg total) by mouth daily.  90 tablet  3  . DISCONTD: lisinopril (PRINIVIL,ZESTRIL) 20 MG tablet Take 2 tablets (40 mg total) by mouth daily.  180 tablet  3  . DISCONTD: simvastatin (ZOCOR) 40 MG tablet Take 1 tablet (40 mg total) by mouth at bedtime.  90 tablet  3  . zoster vaccine live, PF, (ZOSTAVAX) 08657 UNT/0.65ML injection Inject 19,400 Units into the skin once.  1 each  0   Review of Systems: Constitutional: Denies fever, chills, diaphoresis, appetite change and fatigue.  HEENT: Denies photophobia, eye pain, redness, hearing loss, ear pain, congestion, sore  throat, rhinorrhea, sneezing, mouth sores, trouble swallowing, neck pain, neck stiffness and tinnitus.   Respiratory: Denies SOB, DOE, cough, chest tightness,  and wheezing.   Cardiovascular: Denies chest pain; + leg swelling.  Gastrointestinal: Denies nausea, vomiting, abdominal pain, diarrhea, constipation, blood in stool and abdominal distention.  Genitourinary: Denies dysuria, urgency, frequency, hematuria, flank pain and difficulty urinating.  Musculoskeletal: Denies myalgias and gait problem.  Skin: Denies pallor, rash and wound.  Neurological: Denies dizziness,  seizures, syncope, weakness, light-headedness, numbness Psychiatric/Behavioral: Denies suicidal ideation, mood changes, confusion, nervousness, and agitation  Objective:  Physical Exam: Filed Vitals:   11/08/11 0841  BP: 130/79  Pulse: 66  Temp: 97.9 F (36.6 C)  TempSrc: Oral  Weight: 221 lb (100.245 kg)   Constitutional: Vital signs reviewed.  Patient is a well-developed and well-nourished woman  in no acute distress and cooperative with exam.  Mouth: no erythema or exudates, MMM, partial upper dentures  Eyes: PERRL, EOMI, conjunctivae normal, No scleral icterus.  Neck:  no thyromegaly  Cardiovascular: RRR, S1 normal, S2 normal, no MRG, pulses symmetric and intact bilaterally Pulmonary/Chest: CTAB, no wheezes, rales, or rhonchi Abdominal: Soft. Non-tender, non-distended, bowel sounds are normal, no masses, organomegaly, or guarding present.  Neurological: A&O x3, Strength is normal and symmetric bilaterally, cranial nerve II-XII are grossly intact, no focal motor deficit, sensory intact to light touch bilaterally.  MSK: Bilateral lower extremity 1+ pitting edema Skin: Warm, dry and intact. No rash, cyanosis, or clubbing.  Psychiatric: Normal mood and affect. speech and behavior is normal. Judgment and thought content normal. Cognition and memory are normal.   Assessment & Plan:  Patient to discuss with Dr. Josem Kaufmann. Patient to return in 6 months for followup, at which point we will need to perform a Pap smear and recheck lipid panel, as well as reassess headaches.  Patient to return sooner if needed, but she would ideally like only annual followups.  If he problem-oriented charting for further details.

## 2011-11-08 NOTE — Assessment & Plan Note (Addendum)
Lab Results  Component Value Date   NA 141 02/24/2011   K 3.5 02/24/2011   CL 101 02/24/2011   CO2 27 02/24/2011   BUN 15 02/24/2011   CREATININE 0.71 02/24/2011   CREATININE 0.86 06/10/2010    BP Readings from Last 3 Encounters:  11/08/11 130/79  03/15/11 124/78  02/24/11 173/99    Assessment: Hypertension control:  controlled  Progress toward goals:  at goal Barriers to meeting goals:  no barriers identified  Bilateral lower extremity edema is likely related to amlodipine. Echo in 2010 reveals normal systolic and diastolic heart function.  Plan: Hypertension treatment:  continue current medications (amlodipine 10, hydrochlorothiazide 25, lisinopril 40; at this appointment I changed her lisinopril to 40 mg tablets, as she was taking two 20 mg tablets before.)

## 2011-11-08 NOTE — Patient Instructions (Addendum)
-  Please try to cut back on your benadryl.  As you get older, this medication can become increasingly problematic.  Try to go down to 1 tablet, and then try using over the counter melatonin to help with sleep.  If this does not work, please call me and we will try Ambien 5mg  before bed.  -Continue taking your blood pressure and cholesterol medications.  Also, take Aspirin 81mg  daily.  -I will call you with your test results from today.  -Be sure to have your colonoscopy, mammogram & eye exam done.   Please be sure to bring all of your medications with you to every visit.  Should you have any new or worsening symptoms, please be sure to call the clinic at [phone].

## 2011-11-08 NOTE — Assessment & Plan Note (Signed)
Intermittent without chest pain.  No history of thyroid disease in the family.  No role for EKG at this time, as patient is not exhibiting palpitations or chest pain.  -Check TSH -Asked patient to discontinue Benadryl as this may be contributing as well -If symptoms persist patient should return sooner than 6 months

## 2011-11-08 NOTE — Assessment & Plan Note (Signed)
Patient has history of arthritis, with reported Baker's cyst behind right lower extremity appear.  Pain is stable, and does not interfere with her daily activities. I suggested that if pain does become bothersome, to try to Tylenol.

## 2011-11-08 NOTE — Assessment & Plan Note (Addendum)
Random blood glucose will be checked with bmet today.  She does have family history of diabetes, but no symptoms of hyperglycemia such as polyuria, polydipsia and polyphagia.

## 2011-11-08 NOTE — Assessment & Plan Note (Signed)
Patient had a normal Pap smear on 12/27/2010. She denies any vaginal bleeding or discharge. Will review paper chart to understand circumstances of cervical cancer and procedure. Will likely perform Pap smear in 6 months.

## 2011-11-08 NOTE — Assessment & Plan Note (Signed)
Last lipid panel was 02/24/2011. LDL equals 93. Denies muscle pain.  -Continue Zocor 40 mg before bed. -Recheck lipid panel in 6 months.

## 2011-11-08 NOTE — Assessment & Plan Note (Signed)
Patient denies vaginal discharge. She requests to be tested for HIV, as she has 2 female partners with which has unprotected sex.

## 2011-11-08 NOTE — Assessment & Plan Note (Signed)
Headache has persisted for the last month, May be related to vision changes, as she does wear bifocals, which she has not updated in the last 3 years.  She has not tried any medications. She denies recent stressors.   She has no visual field defects and is able to perform eye movements normally, so at this point my suspicion is low for an intracranial process.  Should symptoms persist, we may proceed with an MRI.  -Refer to ophthalmology -Trial of Tylenol for headache -Return sooner than 6 months if symptoms persist

## 2011-11-08 NOTE — Assessment & Plan Note (Signed)
Tetanus and pneumo vaccine given today. Referral for colonoscopy and mammography done today. Prescription for zoster vaccine given today. Referral to ophthalmology given today.

## 2011-12-01 ENCOUNTER — Encounter: Payer: Self-pay | Admitting: Internal Medicine

## 2011-12-01 ENCOUNTER — Ambulatory Visit (AMBULATORY_SURGERY_CENTER): Payer: Medicare Other | Admitting: *Deleted

## 2011-12-01 VITALS — Ht 65.0 in | Wt 215.8 lb

## 2011-12-01 DIAGNOSIS — Z1211 Encounter for screening for malignant neoplasm of colon: Secondary | ICD-10-CM

## 2011-12-01 MED ORDER — NA SULFATE-K SULFATE-MG SULF 17.5-3.13-1.6 GM/177ML PO SOLN: ORAL | Status: DC

## 2011-12-02 ENCOUNTER — Ambulatory Visit (HOSPITAL_COMMUNITY)
Admission: RE | Admit: 2011-12-02 | Discharge: 2011-12-02 | Disposition: A | Discharge status: Home / Self Care | Payer: Medicare Other | Source: Ambulatory Visit | Attending: Internal Medicine | Admitting: Internal Medicine

## 2011-12-02 DIAGNOSIS — Z1239 Encounter for other screening for malignant neoplasm of breast: Secondary | ICD-10-CM

## 2011-12-02 DIAGNOSIS — Z1231 Encounter for screening mammogram for malignant neoplasm of breast: Secondary | ICD-10-CM | POA: Insufficient documentation

## 2011-12-13 ENCOUNTER — Encounter: Payer: Self-pay | Admitting: Internal Medicine

## 2011-12-15 ENCOUNTER — Ambulatory Visit (AMBULATORY_SURGERY_CENTER): Payer: Medicare Other | Admitting: Internal Medicine

## 2011-12-15 ENCOUNTER — Encounter: Payer: Self-pay | Admitting: Internal Medicine

## 2011-12-15 VITALS — BP 134/73 | HR 69 | Temp 96.2°F | Resp 17 | Ht 65.0 in | Wt 215.0 lb

## 2011-12-15 DIAGNOSIS — Z1211 Encounter for screening for malignant neoplasm of colon: Secondary | ICD-10-CM

## 2011-12-15 MED ORDER — SODIUM CHLORIDE 0.9 % IV SOLN: 500.0000 mL | INTRAVENOUS | Status: DC

## 2011-12-15 NOTE — Progress Notes (Signed)
0940.PT . B/P range 77/52-71/41. Pt. Coughing ,but verbally responsive to  [facility] crna .Shon Hough administered ephedrine 10mg  administered and at 0935 fluid bolus given. 0945 b/p = 87/50.pt. Verbal stating that was the best sleep I ever had.

## 2011-12-15 NOTE — Op Note (Signed)
[facility] [address] [place], [place]  [address]  COLONOSCOPY PROCEDURE REPORT  PATIENT:  Sherri Delgado, Sherri Delgado  MR#:  [id number] BIRTHDATE:  April 30, 1946, 66 yrs. old  GENDER:  female ENDOSCOPIST:  Carie Caddy. Jojuan Champney, MD REF. BY:  Vernice Jefferson, M.D. PROCEDURE DATE:  12/15/2011 PROCEDURE:  Colonoscopy 96[address] ASA CLASS:  Class II INDICATIONS:  Routine Risk Screening MEDICATIONS:   MAC sedation, administered by CRNA, propofol (Diprivan) 350 mg IV  DESCRIPTION OF PROCEDURE:   After the risks benefits and alternatives of the procedure were thoroughly explained, informed consent was obtained.  Digital rectal exam was performed and revealed no rectal masses.   The LB [id number] endoscope was introduced through the anus and advanced to the cecum, which was identified by both the appendix and ileocecal valve, without limitations.  The quality of the prep was Suprep good.  The instrument was then slowly withdrawn as the colon was fully examined. <<PROCEDUREIMAGES>> FINDINGS:  Normal colonoscopy without polyps, masses, vascular ectasias, or inflammatory changes.   Retroflexion was unable to be performed due to small rectal size, thus multiple views were taken in different directions across the dentate line which rev ealed no abnormalities. The scope was then withdrawn from the cecum and the procedure completed.  COMPLICATIONS:  None  ENDOSCOPIC IMPRESSION: 1) Normal colonoscopy  RECOMMENDATIONS: 1) You should continue to follow colorectal cancer screening guidelines for "routine risk" patients with a repeat colonoscopy in 10 years. There is no need for FOBT (stool) testing for at least 5 years.  Carie Caddy. Rhea Belton, MD  CC:  The Patient Vernice Jefferson, M.D.  n. eSIGNED:   Carie Caddy. Kirby Cortese at 12/15/2011 09:42 AM  Griffin Basil, [phone]

## 2011-12-15 NOTE — Progress Notes (Signed)
Patient did not experience any of the following events: a burn prior to discharge; a fall within the facility; wrong site/side/patient/procedure/implant event; or a hospital transfer or hospital admission upon discharge from the facility. (G8907) Patient did not have preoperative order for IV antibiotic SSI prophylaxis. (G8918)  

## 2011-12-15 NOTE — Patient Instructions (Addendum)

## 2011-12-16 ENCOUNTER — Telehealth: Payer: Self-pay | Admitting: *Deleted

## 2011-12-16 NOTE — Telephone Encounter (Signed)
  Follow up Call-  Call back number 12/15/2011  Post procedure Call Back phone  # [phone]  Permission to leave phone message Yes     Patient questions:  Do you have a fever, pain , or abdominal swelling? no Pain Score  0 *  Have you tolerated food without any problems? yes  Have you been able to return to your normal activities? yes  Do you have any questions about your discharge instructions: Diet   no Medications  no Follow up visit  no  Do you have questions or concerns about your Care? yes  Actions: * If pain score is 4 or above: No action needed, pain <4.

## 2013-01-16 ENCOUNTER — Ambulatory Visit (INDEPENDENT_AMBULATORY_CARE_PROVIDER_SITE_OTHER): Payer: Medicare Other | Admitting: Internal Medicine

## 2013-01-16 ENCOUNTER — Encounter: Payer: Self-pay | Admitting: Internal Medicine

## 2013-01-16 ENCOUNTER — Other Ambulatory Visit (HOSPITAL_COMMUNITY)
Admission: RE | Admit: 2013-01-16 | Discharge: 2013-01-16 | Disposition: A | Discharge status: Home / Self Care | Payer: Medicare Other | Source: Ambulatory Visit | Attending: Internal Medicine | Admitting: Internal Medicine

## 2013-01-16 VITALS — BP 132/75 | HR 66 | Temp 97.5°F | Wt 214.0 lb

## 2013-01-16 DIAGNOSIS — M25562 Pain in left knee: Secondary | ICD-10-CM

## 2013-01-16 DIAGNOSIS — Z8541 Personal history of malignant neoplasm of cervix uteri: Secondary | ICD-10-CM

## 2013-01-16 DIAGNOSIS — I10 Essential (primary) hypertension: Secondary | ICD-10-CM

## 2013-01-16 DIAGNOSIS — R7989 Other specified abnormal findings of blood chemistry: Secondary | ICD-10-CM

## 2013-01-16 DIAGNOSIS — C539 Malignant neoplasm of cervix uteri, unspecified: Secondary | ICD-10-CM

## 2013-01-16 DIAGNOSIS — Z0000 Encounter for general adult medical examination without abnormal findings: Secondary | ICD-10-CM

## 2013-01-16 DIAGNOSIS — Z124 Encounter for screening for malignant neoplasm of cervix: Secondary | ICD-10-CM | POA: Insufficient documentation

## 2013-01-16 DIAGNOSIS — Z1239 Encounter for other screening for malignant neoplasm of breast: Secondary | ICD-10-CM

## 2013-01-16 DIAGNOSIS — E785 Hyperlipidemia, unspecified: Secondary | ICD-10-CM

## 2013-01-16 DIAGNOSIS — M25569 Pain in unspecified knee: Secondary | ICD-10-CM

## 2013-01-16 LAB — CBC
HCT: 37.8 % (ref 36.0–46.0)
Hemoglobin: 12.9 g/dL (ref 12.0–15.0)
MCH: 28.6 pg (ref 26.0–34.0)
MCHC: 34.1 g/dL (ref 30.0–36.0)
Platelets: 247 10*3/uL (ref 150–400)
WBC: 5.7 10*3/uL (ref 4.0–10.5)

## 2013-01-16 MED ORDER — LISINOPRIL 40 MG PO TABS: 40.0000 mg | ORAL_TABLET | Freq: Every day | ORAL | Status: DC

## 2013-01-16 MED ORDER — AMLODIPINE BESYLATE 10 MG PO TABS: 10.0000 mg | ORAL_TABLET | Freq: Every day | ORAL | Status: DC

## 2013-01-16 MED ORDER — HYDROCHLOROTHIAZIDE 25 MG PO TABS: 25.0000 mg | ORAL_TABLET | Freq: Every day | ORAL | Status: DC

## 2013-01-16 NOTE — Assessment & Plan Note (Signed)
Referral for mammogram today

## 2013-01-16 NOTE — Assessment & Plan Note (Addendum)
BP Readings from Last 3 Encounters:  01/16/13 132/75  12/15/11 134/73  11/08/11 130/79    Lab Results  Component Value Date   NA 139 11/08/2011   K 4.2 11/08/2011   CREATININE 0.83 11/08/2011    Assessment: Blood pressure control: controlled Progress toward BP goal:  at goal  Plan: Medications:  continue current medications - amlodipine 10, hydrochlorothiazide 25, lisinopril 40 Educational resources provided: brochure Self management tools provided: home blood pressure logbook Other plans: Check renal function today Lower extremity edema likely secondary to amlodipine

## 2013-01-16 NOTE — Assessment & Plan Note (Signed)
Check A1c today, especially given complaints of polyuria.

## 2013-01-16 NOTE — Progress Notes (Signed)
Subjective:   Patient ID: Sherri Delgado female   DOB: 29-Aug-1945 67 y.o.   MRN: [id number]  HPI: [name] is a 67 y.o. woman with past medical history significant for hypertension, hyperlipidemia, GERD, history of cervical cancer, and osteopenia. She returns today for annual followup (I last saw pt on 11/08/11).    Trouble walking up and down stairs & long distances, can't walk for long distances, but wants to be clear that she is not complaining - only in left leg, now feels like she is walking Doesn't know what makes pain better Describes pain as 8/10, sharp pain, only on the back of the knee This has been a chronic problem ("forever") Hasn't tried anything; maybe 1% worse over the last few months  Review of Systems: Constitutional: Denies fever, chills, diaphoresis, appetite change and fatigue.  HEENT: Denies photophobia, eye pain, redness, hearing loss, ear pain, congestion, sore throat, rhinorrhea, sneezing, mouth sores, trouble swallowing, neck pain, neck stiffness and tinnitus.  Respiratory: Denies SOB, DOE, cough, chest tightness, and wheezing.  Cardiovascular: Denies chest pain, palpitations and leg swelling.  Gastrointestinal: fecal urge after hysterectomy, not ready for urology Genitourinary: +polyuria (but does drink a lot of fluids); does lose urine with coughing/sneezing; Denies dysuria, urgency, frequency, hematuria, flank pain and difficulty urinating.  Neurological: Denies dizziness, seizures, syncope, weakness, lightheadedness, numbness and headaches.   Past Medical History  Diagnosis Date  . Hypertension   . Hyperlipidemia   . GERD (gastroesophageal reflux disease)   . Obesity   . Exposure to TB     completed 9 months of INH and B6  . Osteopenia     DEXA 08/2003, T score lumbar -1.9  . History of cervical cancer     s/p hysterectomyy. Most recent pap in 2009 normal   Current Outpatient Prescriptions  Medication Sig Dispense Refill  . amLODipine (NORVASC)  10 MG tablet Take 1 tablet (10 mg total) by mouth daily.  90 tablet  4  . aspirin 81 MG tablet Take 81 mg by mouth daily.        . diphenhydrAMINE (SOMINEX) 25 MG tablet Take 50 mg by mouth at bedtime as needed. Patient uses this for sleep      . hydrochlorothiazide (HYDRODIURIL) 25 MG tablet Take 1 tablet (25 mg total) by mouth daily.  90 tablet  4  . lisinopril (PRINIVIL,ZESTRIL) 40 MG tablet Take 1 tablet (40 mg total) by mouth daily.  90 tablet  4  . simvastatin (ZOCOR) 40 MG tablet Take 1 tablet (40 mg total) by mouth at bedtime.  90 tablet  4   No current facility-administered medications for this visit.   Family History  Problem Relation Age of Onset  . Cancer Mother     died from brain tumor  . Cancer Father     died from liver cancer and cirrhosis  . Diabetes Brother   . Aneurysm Sister     died from brain aneurysm bleed  . Colon cancer Neg Hx   . Stomach cancer Neg Hx   . Rectal cancer Neg Hx   . Colon polyps Neg Hx    History   Social History  . Marital Status: Divorced    Spouse Name: N/A    Number of Children: N/A  . Years of Education: N/A   Social History Main Topics  . Smoking status: Never Smoker   . Smokeless tobacco: Never Used  . Alcohol Use: No  . Drug Use: No  . Sexually  Active: Not on file   Other Topics Concern  . Not on file   Social History Narrative   Never smoked, no alcohol or drug use.   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    Objective:  Physical Exam: Filed Vitals:   01/16/13 1351  BP: 132/75  Pulse: 66  Temp: 97.5 F (36.4 C)  TempSrc: Oral  Weight: 214 lb (97.07 kg)  SpO2: 97%    Constitutional: Vital signs reviewed. Patient is an obese woman in no acute distress and cooperative with exam.  Mouth: no erythema or exudates, MMM, partial upper dentures  Eyes: PERRL, EOMI, conjunctivae normal, No scleral icterus.  Neck: no thyromegaly  Cardiovascular: RRR, S1 normal, S2 normal, no MRG,  pulses symmetric and intact bilaterally  Pulmonary/Chest: CTAB, no wheezes, rales, or rhonchi  Abdominal: Soft. Non-tender, non-distended, bowel sounds are normal, no masses, organomegaly, or guarding present.  GU: vaginal cuff without erythema/friable mucosa, no adnexal masses, mild bleeding upon speculum removal Neurological: A&O x3, Strength is normal and symmetric bilaterally, cranial nerve II-XII are grossly intact, no focal motor deficit, sensory intact to light touch bilaterally.  MSK: Bilateral lower extremity 1+ pitting edema, palpable cyst on posterior left knee Skin: Warm, dry and intact. No rash, cyanosis, or clubbing.  Psychiatric: Normal mood and affect. speech and behavior is normal. Judgment and thought content normal. Cognition and memory are normal.    Assessment & Plan:   Case and care discussed with Dr. Criselda Peaches.  Please see problem oriented charting for further details. Patient to return in 1 year for routine follow up.  Med prescriptions for 1 year.

## 2013-01-16 NOTE — Assessment & Plan Note (Addendum)
Patient would like to minimize medications. Discontinue simvastatin and check lipid profile today. Ten-year risk cordis 4% based on last lipid profile. She has 2 risk factors (age and hypertension), with a 10 year risk of less than 10%, so she would initiate lifestyle changes at LDL 130, and begin considering pharmacological treatment at LDL 160. If we decide to reinitiate statin therapy, I would likely choose a more benign statin other than simvastatin, which has less drug interactions (she is on amlodipine)

## 2013-01-16 NOTE — Assessment & Plan Note (Signed)
We discussed this at last visit as well. Pain is not overly bothersome, but I suggested that if it is, she may try ibuprofen or Tylenol. She may also try a knee brace, given her history of arthritis, she would also benefit from quadriceps strengthening.

## 2013-01-16 NOTE — Assessment & Plan Note (Signed)
Pap smear of vaginal cuff today

## 2013-01-16 NOTE — Patient Instructions (Addendum)
General Instructions: -Your blood pressure looks great! Keep up the good work!  -We are going to try you off of our simvastatin.  We will recheck your cholesterol in 1 year and reassess.  -For your baker's cyst, we can continue conservative management.  Your knee will do better if you strengthen your quadricep muscles.  You can use a knee brace for support if you will be doing a lot of walking.  You can use tylenol or ibuprofen if needed.  If pain worsens, we can consider steroid injections.    Please be sure to bring all of your medications with you to every visit.  Should you have any new or worsening symptoms, please be sure to call the clinic at [phone].   Treatment Goals:  Goals (1 Years of Data) as of 01/16/13         As of Today 12/15/11     Blood Pressure    . Blood Pressure < 140/90  132/75 134/73      Progress Toward Treatment Goals:  Treatment Goal 01/16/2013  Blood pressure at goal    Self Care Goals & Plans:  Self Care Goal 01/16/2013  Manage my medications take my medicines as prescribed; refill my medications on time; bring my medications to every visit  Eat healthy foods eat foods that are low in salt; eat baked foods instead of fried foods  Be physically active find an activity I enjoy  Meeting treatment goals maintain the current self-care plan

## 2013-01-17 LAB — HEMOGLOBIN A1C
Hgb A1c MFr Bld: 5.5 % (ref <5.7)
Mean Plasma Glucose: 111 mg/dL (ref <117)

## 2013-01-17 LAB — COMPREHENSIVE METABOLIC PANEL
ALT: 14 U/L (ref 0–35)
AST: 16 U/L (ref 0–37)
Albumin: 4.2 g/dL (ref 3.5–5.2)
Alkaline Phosphatase: 58 U/L (ref 39–117)
BUN: 16 mg/dL (ref 6–23)
CO2: 29 mEq/L (ref 19–32)
Creat: 0.77 mg/dL (ref 0.50–1.10)
Potassium: 3.5 mEq/L (ref 3.5–5.3)
Sodium: 138 mEq/L (ref 135–145)
Total Protein: 8.1 g/dL (ref 6.0–8.3)

## 2013-01-17 LAB — LIPID PANEL
HDL: 46 mg/dL (ref >39)
LDL Cholesterol: 91 mg/dL (ref 0–99)
Total CHOL/HDL Ratio: 3.4 Ratio

## 2013-01-18 NOTE — Progress Notes (Signed)
Case discussed with Dr. Sharda soon after the resident saw the patient.  We reviewed the resident's history and exam and pertinent patient test results.  I agree with the assessment, diagnosis, and plan of care documented in the resident's note. 

## 2013-01-30 ENCOUNTER — Ambulatory Visit (HOSPITAL_COMMUNITY)
Admission: RE | Admit: 2013-01-30 | Discharge: 2013-01-30 | Disposition: A | Discharge status: Home / Self Care | Payer: Medicare Other | Source: Ambulatory Visit | Attending: Internal Medicine | Admitting: Internal Medicine

## 2013-01-30 DIAGNOSIS — Z1231 Encounter for screening mammogram for malignant neoplasm of breast: Secondary | ICD-10-CM | POA: Insufficient documentation

## 2013-01-30 DIAGNOSIS — Z1239 Encounter for other screening for malignant neoplasm of breast: Secondary | ICD-10-CM

## 2013-09-23 ENCOUNTER — Other Ambulatory Visit: Payer: Self-pay | Admitting: *Deleted

## 2013-09-23 DIAGNOSIS — I10 Essential (primary) hypertension: Secondary | ICD-10-CM

## 2013-09-23 NOTE — Telephone Encounter (Signed)
Appt is May 1st; states she will be out before her appt.  Thanks

## 2013-09-25 MED ORDER — LISINOPRIL 40 MG PO TABS: 40.0000 mg | ORAL_TABLET | Freq: Every day | ORAL | Status: DC

## 2013-09-25 MED ORDER — AMLODIPINE BESYLATE 10 MG PO TABS: 10.0000 mg | ORAL_TABLET | Freq: Every day | ORAL | Status: DC

## 2013-09-25 MED ORDER — HYDROCHLOROTHIAZIDE 25 MG PO TABS: 25.0000 mg | ORAL_TABLET | Freq: Every day | ORAL | Status: DC

## 2013-11-01 ENCOUNTER — Ambulatory Visit (INDEPENDENT_AMBULATORY_CARE_PROVIDER_SITE_OTHER): Payer: Medicare HMO | Admitting: Internal Medicine

## 2013-11-01 ENCOUNTER — Encounter: Payer: Self-pay | Admitting: Internal Medicine

## 2013-11-01 ENCOUNTER — Other Ambulatory Visit (HOSPITAL_COMMUNITY)
Admission: RE | Admit: 2013-11-01 | Discharge: 2013-11-01 | Disposition: A | Discharge status: Home / Self Care | Payer: Medicare HMO | Source: Ambulatory Visit | Attending: Internal Medicine | Admitting: Internal Medicine

## 2013-11-01 VITALS — BP 120/74 | HR 65 | Temp 97.7°F | Wt 204.6 lb

## 2013-11-01 DIAGNOSIS — M949 Disorder of cartilage, unspecified: Secondary | ICD-10-CM

## 2013-11-01 DIAGNOSIS — I10 Essential (primary) hypertension: Secondary | ICD-10-CM

## 2013-11-01 DIAGNOSIS — R7989 Other specified abnormal findings of blood chemistry: Secondary | ICD-10-CM

## 2013-11-01 DIAGNOSIS — M899 Disorder of bone, unspecified: Secondary | ICD-10-CM

## 2013-11-01 DIAGNOSIS — K625 Hemorrhage of anus and rectum: Secondary | ICD-10-CM

## 2013-11-01 DIAGNOSIS — Z0000 Encounter for general adult medical examination without abnormal findings: Secondary | ICD-10-CM

## 2013-11-01 DIAGNOSIS — C539 Malignant neoplasm of cervix uteri, unspecified: Secondary | ICD-10-CM

## 2013-11-01 DIAGNOSIS — Z1239 Encounter for other screening for malignant neoplasm of breast: Secondary | ICD-10-CM

## 2013-11-01 DIAGNOSIS — Z124 Encounter for screening for malignant neoplasm of cervix: Secondary | ICD-10-CM | POA: Insufficient documentation

## 2013-11-01 DIAGNOSIS — E785 Hyperlipidemia, unspecified: Secondary | ICD-10-CM

## 2013-11-01 LAB — COMPREHENSIVE METABOLIC PANEL
ALBUMIN: 4.3 g/dL (ref 3.5–5.2)
ALT: 15 U/L (ref 0–35)
AST: 18 U/L (ref 0–37)
Alkaline Phosphatase: 70 U/L (ref 39–117)
BUN: 14 mg/dL (ref 6–23)
CALCIUM: 10.1 mg/dL (ref 8.4–10.5)
CHLORIDE: 100 meq/L (ref 96–112)
CO2: 31 meq/L (ref 19–32)
CREATININE: 0.72 mg/dL (ref 0.50–1.10)
GLUCOSE: 92 mg/dL (ref 70–99)
POTASSIUM: 3.5 meq/L (ref 3.5–5.3)
Sodium: 139 mEq/L (ref 135–145)
Total Bilirubin: 0.6 mg/dL (ref 0.2–1.2)
Total Protein: 8.2 g/dL (ref 6.0–8.3)

## 2013-11-01 LAB — LIPID PANEL
CHOL/HDL RATIO: 4.5 ratio
CHOLESTEROL: 235 mg/dL — AB (ref 0–200)
HDL: 52 mg/dL (ref >39)
LDL Cholesterol: 163 mg/dL — ABNORMAL HIGH (ref 0–99)
TRIGLYCERIDES: 102 mg/dL (ref <150)
VLDL: 20 mg/dL (ref 0–40)

## 2013-11-01 MED ORDER — CALCIUM CARBONATE ANTACID 500 MG PO CHEW: 2.0000 | CHEWABLE_TABLET | Freq: Three times a day (TID) | ORAL | Status: DC

## 2013-11-01 NOTE — Assessment & Plan Note (Signed)
-   Start calcium supp, check Vit D, Repeat dexa at next visit

## 2013-11-01 NOTE — Progress Notes (Signed)
Subjective:   Patient ID: Sherri Delgado female   DOB: 31-Jan-1946 68 y.o.   MRN: [id number]  HPI: [name] is a 68 y.o. woman with h/o HTN, hyperglycemia, cervical Ca and osteopenia presents for routine follow up.   Didn't take meds this morning because thought had to be NPO for blood work.  History of b/l cataract operations.  Has been seeing spots in left eye, but now appears like a "C" shaped thread.   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  No complaints at all.  No recent illness.  Remains sexually active with one partner.    Review of Systems: Constitutional: Denies fever, chills, diaphoresis, appetite change and fatigue.  HEENT: Denies photophobia, eye pain, redness, hearing loss, ear pain, congestion, sore throat, rhinorrhea, sneezing, mouth sores, trouble swallowing, neck pain, neck stiffness and tinnitus.  Respiratory: Denies SOB, DOE, cough, chest tightness, and wheezing.  Cardiovascular: Denies chest pain, palpitations and leg swelling.  Gastrointestinal: Denies nausea, vomiting, abdominal pain, diarrhea, constipation,blood in stool and abdominal distention.  Genitourinary: Denies dysuria, urgency, frequency, hematuria, flank pain and difficulty urinating.  Musculoskeletal: Denies myalgias, back pain, joint swelling, and gait problem. +b/l knee pain, sometimes arthritis pill with relief  Skin: Denies pallor, rash and wound.  Neurological: Denies dizziness, seizures, syncope, weakness, lightheadedness, numbness and headaches.  Psychiatric/Behavioral: Denies suicidal ideation, mood changes, confusion, nervousness, and agitation   Past Medical History  Diagnosis Date  . Hypertension   . Hyperlipidemia   . GERD (gastroesophageal reflux disease)   . Obesity   . Exposure to TB     completed 9 months of INH and B6  . Osteopenia     DEXA 08/2003, T score lumbar -1.9  . History of cervical cancer    s/p hysterectomyy. Most recent pap in 2009 normal   Current Outpatient Prescriptions  Medication Sig Dispense Refill  . amLODipine (NORVASC) 10 MG tablet Take 1 tablet (10 mg total) by mouth daily.  90 tablet  1  . aspirin 81 MG tablet Take 81 mg by mouth daily.        . diphenhydrAMINE (SOMINEX) 25 MG tablet Take 50 mg by mouth at bedtime as needed. Patient uses this for sleep      . hydrochlorothiazide (HYDRODIURIL) 25 MG tablet Take 1 tablet (25 mg total) by mouth daily.  90 tablet  1  . lisinopril (PRINIVIL,ZESTRIL) 40 MG tablet Take 1 tablet (40 mg total) by mouth daily.  90 tablet  1   No current facility-administered medications for this visit.   Family History  Problem Relation Age of Onset  . Cancer Mother     died from brain tumor  . Cancer Father     died from liver cancer and cirrhosis  . Diabetes Brother   . Aneurysm Sister     died from brain aneurysm bleed  . Colon cancer Neg Hx   . Stomach cancer Neg Hx   . Rectal cancer Neg Hx   . Colon polyps Neg Hx    History   Social History  . Marital Status: Divorced    Spouse Name: N/A    Number of Children: N/A  . Years of Education: N/A   Social History Main Topics  . Smoking status: Never Smoker   . Smokeless tobacco: Never Used  . Alcohol Use: No  . Drug Use: No  . Sexual Activity: Not on file   Other Topics  Concern  . Not on file   Social History Narrative   Never smoked, no alcohol or drug use.   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    Objective:  Physical Exam: Filed Vitals:   11/01/13 1317 11/01/13 1343  BP: 146/86 120/74  Pulse: 65   Temp: 97.7 F (36.5 C)   TempSrc: Oral   Weight: 204 lb 9.6 oz (92.806 kg)   SpO2: 97%    General: no acute distress, pleasant, appears as stated age HEENT: PERRL, EOMI, no scleral icterus Cardiac: RRR, no rubs, murmurs or gallops Pulm: clear to auscultation bilaterally, moving normal volumes of air Abd: soft, nontender,  nondistended, BS normoactive Ext: warm and well perfused, no pedal edema Neuro: alert and oriented X3, cranial nerves II-XII grossly intact, strength 5/5 bL UE & LE GU: vaginal cuff bloody, no adnexal masses (sexually active last night)  Assessment & Plan:  Case and care discussed with Dr. Ellwood Dense.  Please see problem oriented charting for further details. Patient to return in 6 months for routine.

## 2013-11-01 NOTE — Assessment & Plan Note (Signed)
Statin d/c at last visit, check lipids today.

## 2013-11-01 NOTE — Assessment & Plan Note (Signed)
BP Readings from Last 3 Encounters:  11/01/13 120/74  01/16/13 132/75  12/15/11 134/73    Lab Results  Component Value Date   NA 138 01/16/2013   K 3.5 01/16/2013   CREATININE 0.77 01/16/2013    Assessment: Blood pressure control: controlled Progress toward BP goal:  at goal  Plan: Medications:  continue current medications - amlodipine 10, hctz 25, lisinopril 40 Educational resources provided: Public relations account executive tools provided: home blood pressure logbook Other plans: CMET today

## 2013-11-01 NOTE — Assessment & Plan Note (Addendum)
Repeat pap of vaginal cuff. Rec annual repeat until 3 negatives and no change in sexual partner.

## 2013-11-01 NOTE — Progress Notes (Signed)
Case discussed with Dr. Sharda at the time of the visit.  We reviewed the resident's history and exam and pertinent patient test results.  I agree with the assessment, diagnosis, and plan of care documented in the resident's note.    

## 2013-11-01 NOTE — Assessment & Plan Note (Signed)
Referral for mammo today

## 2013-11-01 NOTE — Patient Instructions (Signed)
General Instructions: - Your blood pressure looks great, keep it up!  -You have lost 10 points since our last visit (214 --> 204 lbs) , GREAT JOB!!!  - Start calcium 2 tabs three times daily with meals for your bone health - if you have dairy such as yogurt or milk in your diet, you make take calcium twice daily (goal is 1200mg  of elemental calcium per day)  - We are going to recheck your cholesterol & kidneys today  Please be sure to bring all of your medications with you to every visit.  Should you have any new or worsening symptoms, please be sure to call the clinic at [phone].     Treatment Goals:  Goals (1 Years of Data) as of 11/01/13         As of Today As of Today 01/16/13 12/15/11     Blood Pressure    . Blood Pressure < 140/90  120/74 146/86 132/75 134/73      Progress Toward Treatment Goals:  Treatment Goal 11/01/2013  Blood pressure at goal    Self Care Goals & Plans:  Self Care Goal 11/01/2013  Manage my medications take my medicines as prescribed; bring my medications to every visit; refill my medications on time  Monitor my health keep track of my blood pressure  Eat healthy foods eat foods that are low in salt; eat baked foods instead of fried foods  Be physically active find an activity I enjoy  Meeting treatment goals maintain the current self-care plan

## 2013-11-01 NOTE — Assessment & Plan Note (Signed)
Last A1c 5.5, recheck 2017 unless suspicion rises to check sooner.

## 2013-11-02 LAB — VITAMIN D 25 HYDROXY (VIT D DEFICIENCY, FRACTURES): VIT D 25 HYDROXY: 17 ng/mL — AB (ref 30–89)

## 2013-11-04 ENCOUNTER — Ambulatory Visit (INDEPENDENT_AMBULATORY_CARE_PROVIDER_SITE_OTHER): Payer: Medicare HMO | Admitting: Internal Medicine

## 2013-11-04 ENCOUNTER — Encounter: Payer: Self-pay | Admitting: Nurse Practitioner

## 2013-11-04 ENCOUNTER — Telehealth: Payer: Self-pay | Admitting: *Deleted

## 2013-11-04 ENCOUNTER — Encounter: Payer: Self-pay | Admitting: Internal Medicine

## 2013-11-04 VITALS — BP 106/73 | HR 73 | Temp 97.6°F | Ht 65.0 in | Wt 199.8 lb

## 2013-11-04 DIAGNOSIS — K625 Hemorrhage of anus and rectum: Secondary | ICD-10-CM

## 2013-11-04 LAB — CBC
HCT: 36.3 % (ref 36.0–46.0)
Hemoglobin: 13.2 g/dL (ref 12.0–15.0)
MCH: 29.4 pg (ref 26.0–34.0)
MCHC: 36.4 g/dL — AB (ref 30.0–36.0)
MCV: 80.8 fL (ref 78.0–100.0)
PLATELETS: 257 10*3/uL (ref 150–400)
RBC: 4.49 MIL/uL (ref 3.87–5.11)
RDW: 13.1 % (ref 11.5–15.5)
WBC: 11.8 10*3/uL — ABNORMAL HIGH (ref 4.0–10.5)

## 2013-11-04 NOTE — Assessment & Plan Note (Signed)
Most likely bleeding (painless) from internal hemorrhoid after GI illness (diarrhea/vomiting), seems to be getting better and not orthostatic (though SBP did drop 140s --> 120s from arrival to when we checked orthostatics).  Review of 2013 colonoscopy did not suggest diverticulosis or hemorrhoids for that matter but unable to retroflex.  Hemodynamically stable and without abdominal pain, so doubt serious etiology such as ischemic bowel.  Patient unable to give stool sample, but no risk factors for C.diff.  Will avoid abx since improving and concern for EHEC.  -Stat CBC, but hemodynamically stable so will d/c to home with instructions to stay with someone tonight -Hold ASA and antihypertensives until bleeding resolved -refer to GI given persistent bleeding - scheduled for May 7th at 9:30a with Tye Savoy - RTC if no improvement, if worsens may need to go to ED.

## 2013-11-04 NOTE — Telephone Encounter (Signed)
Pt calls and states she started having rectal bleeding last pm, she at first states it is real bad then when told she would need to go to ED she states it is just a little each time she goes to void but she states it is rectal bleeding not vaginal.

## 2013-11-04 NOTE — Patient Instructions (Addendum)
-  I have made an appointment with the GI nurse practitioner, Tye Savoy at [facility] on Thursday May 7th at 9:30pm.  If things get worse, you can call to try to get in sooner.    [facility]: [address] . [place], [place] [phone]; Our office hours are from 8:00 a.m. to 5:00 p.m., Monday-Friday.  -Please stay with someone tonight so they can keep an eye on you  Please be sure to bring all of your medications with you to every visit.  Should you have any new or worsening symptoms, please be sure to call the clinic at [phone].

## 2013-11-04 NOTE — Progress Notes (Signed)
Subjective:   Patient ID: Sherri Delgado female   DOB: 1945-11-15 68 y.o.   MRN: [id number]  HPI: Sherri Delgado is a 68 y.o. woman with h/o HTN and osteopenia who was most recently seen by me on 11/01/13 presents for an acute visit due to BRBPR.   Got sick last night, vomited several times, then all of a sudden was also bleeding from the rectum (was having bloody diarrhea).  When she walks, she can feel blood coming out and has seen a blood clot drop in toilet.  About 20-25 years ago, did have some bleeding after colonoscopy, but no bleeding after most recent colonoscopy 2 years ago.  No further vomiting, last emesis was about 10-11p. No nausea.  Yesterday had 5 episodes of diarrhea - last at 10-11p at night. Persistent blood today without stool.  No fever. No sick contact.  Did have abdominal pain - "upset stomach" but that has resolved, not crampy.   Takes a baby ASA.    Review of Systems: Constitutional: Denies fever, chills, diaphoresis, appetite change and fatigue.  HEENT: Denies photophobia, eye pain, redness, hearing loss, ear pain, congestion, sore throat, rhinorrhea, sneezing, mouth sores, trouble swallowing, neck pain, neck stiffness and tinnitus.  Respiratory: Denies SOB, DOE, cough, chest tightness, and wheezing.  Cardiovascular: Denies chest pain, palpitations and leg swelling.  Gastrointestinal: Denies nausea, vomiting, abdominal pain, diarrhea, constipation,blood in stool and abdominal distention.  Genitourinary: Denies dysuria, urgency, frequency, hematuria, flank pain and difficulty urinating.  Musculoskeletal: Denies myalgias, back pain, joint swelling, arthralgias and gait problem.  Skin: Denies pallor, rash and wound.  Neurological: Denies dizziness, seizures, syncope, weakness, lightheadedness, numbness and headaches.     Past Medical History  Diagnosis Date  . Hypertension   . Hyperlipidemia   . GERD (gastroesophageal reflux disease)   . Obesity   . Exposure to TB      completed 9 months of INH and B6  . Osteopenia     DEXA 08/2003, T score lumbar -1.9  . History of cervical cancer     s/p hysterectomyy. Most recent pap in 2009 normal   Current Outpatient Prescriptions  Medication Sig Dispense Refill  . amLODipine (NORVASC) 10 MG tablet Take 1 tablet (10 mg total) by mouth daily.  90 tablet  1  . aspirin 81 MG tablet Take 81 mg by mouth daily.        . calcium carbonate (TUMS) 500 MG chewable tablet Chew 2 tablets (400 mg of elemental calcium total) by mouth 3 (three) times daily with meals.  270 tablet  3  . diphenhydrAMINE (SOMINEX) 25 MG tablet Take 50 mg by mouth at bedtime as needed. Patient uses this for sleep      . hydrochlorothiazide (HYDRODIURIL) 25 MG tablet Take 1 tablet (25 mg total) by mouth daily.  90 tablet  1  . lisinopril (PRINIVIL,ZESTRIL) 40 MG tablet Take 1 tablet (40 mg total) by mouth daily.  90 tablet  1   No current facility-administered medications for this visit.   Family History  Problem Relation Age of Onset  . Cancer Mother     died from brain tumor  . Cancer Father     died from liver cancer and cirrhosis  . Diabetes Brother   . Aneurysm Sister     died from brain aneurysm bleed  . Colon cancer Neg Hx   . Stomach cancer Neg Hx   . Rectal cancer Neg Hx   . Colon polyps Neg Hx  History   Social History  . Marital Status: Divorced    Spouse Name: N/A    Number of Children: N/A  . Years of Education: N/A   Social History Main Topics  . Smoking status: Never Smoker   . Smokeless tobacco: Never Used  . Alcohol Use: No  . Drug Use: No  . Sexual Activity: Not on file   Other Topics Concern  . Not on file   Social History Narrative   Never smoked, no alcohol or drug use.   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    Objective:  Physical Exam: Filed Vitals:   11/04/13 1425 11/04/13 1445 11/04/13 1447 11/04/13 1449  BP: 142/91 118/76 123/79 106/73  Pulse: 55 67 63 73    Temp: 97.6 F (36.4 C)     TempSrc: Oral     Height: 5\' 5"  (1.651 m)     Weight: 199 lb 12.8 oz (90.629 kg)     SpO2: 98%      General: no distress HEENT: PERRL, EOMI, no scleral icterus Cardiac: RRR, no rubs, murmurs or gallops Pulm: clear to auscultation bilaterally, moving normal volumes of air Abd: soft, nontender, nondistended, BS normoactive, +frank blood in rectal vault without obvious hemorrhoid Ext: warm and well perfused, no pedal edema Neuro: alert and oriented X3, cranial nerves II-XII grossly intact  Assessment & Plan:  Case and care discussed with Dr. Ellwood Dense.  Please see problem oriented charting for further details.

## 2013-11-05 ENCOUNTER — Encounter: Payer: Self-pay | Admitting: *Deleted

## 2013-11-05 NOTE — Progress Notes (Signed)
Case discussed with Dr. Sharda at the time of the visit.  We reviewed the resident's history and exam and pertinent patient test results.  I agree with the assessment, diagnosis, and plan of care documented in the resident's note.    

## 2013-11-07 ENCOUNTER — Ambulatory Visit: Payer: Medicare HMO | Admitting: Nurse Practitioner

## 2013-11-13 ENCOUNTER — Encounter (INDEPENDENT_AMBULATORY_CARE_PROVIDER_SITE_OTHER): Payer: Medicare HMO | Admitting: Ophthalmology

## 2013-11-13 DIAGNOSIS — H4320 Crystalline deposits in vitreous body, unspecified eye: Secondary | ICD-10-CM

## 2013-11-13 DIAGNOSIS — H35039 Hypertensive retinopathy, unspecified eye: Secondary | ICD-10-CM

## 2013-11-13 DIAGNOSIS — H43819 Vitreous degeneration, unspecified eye: Secondary | ICD-10-CM

## 2013-11-13 DIAGNOSIS — I10 Essential (primary) hypertension: Secondary | ICD-10-CM

## 2013-11-13 DIAGNOSIS — H26499 Other secondary cataract, unspecified eye: Secondary | ICD-10-CM

## 2013-11-27 ENCOUNTER — Ambulatory Visit (INDEPENDENT_AMBULATORY_CARE_PROVIDER_SITE_OTHER): Payer: Medicare HMO | Admitting: Ophthalmology

## 2013-11-27 DIAGNOSIS — H4320 Crystalline deposits in vitreous body, unspecified eye: Secondary | ICD-10-CM

## 2013-11-27 DIAGNOSIS — H2700 Aphakia, unspecified eye: Secondary | ICD-10-CM

## 2014-01-30 ENCOUNTER — Other Ambulatory Visit: Payer: Self-pay | Admitting: Internal Medicine

## 2014-01-30 ENCOUNTER — Ambulatory Visit (HOSPITAL_COMMUNITY)
Admission: RE | Admit: 2014-01-30 | Discharge: 2014-01-30 | Disposition: A | Discharge status: Home / Self Care | Payer: Medicare HMO | Source: Ambulatory Visit | Attending: Internal Medicine | Admitting: Internal Medicine

## 2014-01-30 DIAGNOSIS — Z1231 Encounter for screening mammogram for malignant neoplasm of breast: Secondary | ICD-10-CM | POA: Diagnosis not present

## 2014-01-30 DIAGNOSIS — Z1239 Encounter for other screening for malignant neoplasm of breast: Secondary | ICD-10-CM

## 2014-01-31 ENCOUNTER — Ambulatory Visit (HOSPITAL_COMMUNITY): Payer: Medicare HMO

## 2014-03-19 ENCOUNTER — Encounter (HOSPITAL_COMMUNITY): Payer: Self-pay | Admitting: Emergency Medicine

## 2014-03-19 ENCOUNTER — Emergency Department (HOSPITAL_COMMUNITY): Payer: Medicare HMO

## 2014-03-19 ENCOUNTER — Emergency Department (HOSPITAL_COMMUNITY)
Admission: EM | Admit: 2014-03-19 | Discharge: 2014-03-19 | Disposition: A | Discharge status: Home / Self Care | Payer: Medicare HMO | Attending: Emergency Medicine | Admitting: Emergency Medicine

## 2014-03-19 DIAGNOSIS — Z8541 Personal history of malignant neoplasm of cervix uteri: Secondary | ICD-10-CM | POA: Insufficient documentation

## 2014-03-19 DIAGNOSIS — R112 Nausea with vomiting, unspecified: Secondary | ICD-10-CM | POA: Diagnosis not present

## 2014-03-19 DIAGNOSIS — R197 Diarrhea, unspecified: Secondary | ICD-10-CM | POA: Insufficient documentation

## 2014-03-19 DIAGNOSIS — Z201 Contact with and (suspected) exposure to tuberculosis: Secondary | ICD-10-CM | POA: Insufficient documentation

## 2014-03-19 DIAGNOSIS — Z79899 Other long term (current) drug therapy: Secondary | ICD-10-CM | POA: Insufficient documentation

## 2014-03-19 DIAGNOSIS — Z7982 Long term (current) use of aspirin: Secondary | ICD-10-CM | POA: Insufficient documentation

## 2014-03-19 DIAGNOSIS — R109 Unspecified abdominal pain: Secondary | ICD-10-CM | POA: Diagnosis present

## 2014-03-19 DIAGNOSIS — Z862 Personal history of diseases of the blood and blood-forming organs and certain disorders involving the immune mechanism: Secondary | ICD-10-CM | POA: Insufficient documentation

## 2014-03-19 DIAGNOSIS — I10 Essential (primary) hypertension: Secondary | ICD-10-CM | POA: Insufficient documentation

## 2014-03-19 DIAGNOSIS — M899 Disorder of bone, unspecified: Secondary | ICD-10-CM | POA: Diagnosis not present

## 2014-03-19 DIAGNOSIS — E669 Obesity, unspecified: Secondary | ICD-10-CM | POA: Insufficient documentation

## 2014-03-19 DIAGNOSIS — K219 Gastro-esophageal reflux disease without esophagitis: Secondary | ICD-10-CM | POA: Diagnosis not present

## 2014-03-19 DIAGNOSIS — Z8639 Personal history of other endocrine, nutritional and metabolic disease: Secondary | ICD-10-CM | POA: Insufficient documentation

## 2014-03-19 DIAGNOSIS — M949 Disorder of cartilage, unspecified: Secondary | ICD-10-CM

## 2014-03-19 DIAGNOSIS — Z90710 Acquired absence of both cervix and uterus: Secondary | ICD-10-CM | POA: Diagnosis not present

## 2014-03-19 LAB — CBC WITH DIFFERENTIAL/PLATELET
BASOS ABS: 0 10*3/uL (ref 0.0–0.1)
Basophils Relative: 0 % (ref 0–1)
Eosinophils Absolute: 0 10*3/uL (ref 0.0–0.7)
Eosinophils Relative: 0 % (ref 0–5)
HCT: 38.4 % (ref 36.0–46.0)
Hemoglobin: 13.7 g/dL (ref 12.0–15.0)
LYMPHS PCT: 13 % (ref 12–46)
Lymphs Abs: 0.9 10*3/uL (ref 0.7–4.0)
MCH: 29.3 pg (ref 26.0–34.0)
MCHC: 35.7 g/dL (ref 30.0–36.0)
MCV: 82.2 fL (ref 78.0–100.0)
Monocytes Absolute: 0.2 10*3/uL (ref 0.1–1.0)
Monocytes Relative: 3 % (ref 3–12)
NEUTROS ABS: 6 10*3/uL (ref 1.7–7.7)
NEUTROS PCT: 84 % — AB (ref 43–77)
PLATELETS: 208 10*3/uL (ref 150–400)
RBC: 4.67 MIL/uL (ref 3.87–5.11)
RDW: 13.5 % (ref 11.5–15.5)
WBC: 7.1 10*3/uL (ref 4.0–10.5)

## 2014-03-19 LAB — URINALYSIS, ROUTINE W REFLEX MICROSCOPIC
BILIRUBIN URINE: NEGATIVE
Glucose, UA: NEGATIVE mg/dL
KETONES UR: 40 mg/dL — AB
Leukocytes, UA: NEGATIVE
NITRITE: NEGATIVE
PROTEIN: 100 mg/dL — AB
SPECIFIC GRAVITY, URINE: 1.019 (ref 1.005–1.030)
UROBILINOGEN UA: 0.2 mg/dL (ref 0.0–1.0)
pH: 5.5 (ref 5.0–8.0)

## 2014-03-19 LAB — WET PREP, GENITAL
Trich, Wet Prep: NONE SEEN
Yeast Wet Prep HPF POC: NONE SEEN

## 2014-03-19 LAB — COMPREHENSIVE METABOLIC PANEL
ALK PHOS: 70 U/L (ref 39–117)
ALT: 13 U/L (ref 0–35)
ANION GAP: 13 (ref 5–15)
AST: 18 U/L (ref 0–37)
Albumin: 4.1 g/dL (ref 3.5–5.2)
BILIRUBIN TOTAL: 0.5 mg/dL (ref 0.3–1.2)
BUN: 14 mg/dL (ref 6–23)
CO2: 27 meq/L (ref 19–32)
Calcium: 9.9 mg/dL (ref 8.4–10.5)
Chloride: 96 mEq/L (ref 96–112)
Creatinine, Ser: 0.65 mg/dL (ref 0.50–1.10)
GFR, EST NON AFRICAN AMERICAN: 90 mL/min — AB (ref >90)
GLUCOSE: 107 mg/dL — AB (ref 70–99)
POTASSIUM: 3.3 meq/L — AB (ref 3.7–5.3)
Sodium: 136 mEq/L — ABNORMAL LOW (ref 137–147)
Total Protein: 9 g/dL — ABNORMAL HIGH (ref 6.0–8.3)

## 2014-03-19 LAB — URINE MICROSCOPIC-ADD ON

## 2014-03-19 LAB — LIPASE, BLOOD: Lipase: 23 U/L (ref 11–59)

## 2014-03-19 MED ORDER — ONDANSETRON HCL 4 MG/2ML IJ SOLN
4.0000 mg | Freq: Once | INTRAMUSCULAR | Status: DC
Start: 2014-03-19 — End: 2014-03-19

## 2014-03-19 MED ORDER — ONDANSETRON 4 MG PO TBDP: 4.0000 mg | ORAL_TABLET | Freq: Three times a day (TID) | ORAL | Status: DC | PRN

## 2014-03-19 MED ORDER — HYDROCODONE-ACETAMINOPHEN 5-325 MG PO TABS: 1.0000 | ORAL_TABLET | ORAL | Status: DC | PRN

## 2014-03-19 MED ORDER — MORPHINE SULFATE 4 MG/ML IJ SOLN: 4.0000 mg | Freq: Once | INTRAMUSCULAR | Status: DC

## 2014-03-19 MED ORDER — ONDANSETRON 4 MG PO TBDP
4.0000 mg | ORAL_TABLET | Freq: Once | ORAL | Status: AC
  Administered 2014-03-19: 4 mg via ORAL
  Filled 2014-03-19: qty 1

## 2014-03-19 MED ORDER — IBUPROFEN 800 MG PO TABS
800.0000 mg | ORAL_TABLET | Freq: Once | ORAL | Status: AC
  Administered 2014-03-19: 800 mg via ORAL
  Filled 2014-03-19: qty 1

## 2014-03-19 NOTE — ED Provider Notes (Signed)
CSN: [id number]     Arrival date & time 03/19/14  1800 History   First MD Initiated Contact with Patient 03/19/14 2012     Chief Complaint  Patient presents with  . Abdominal Pain  . Emesis   Patient is a 68 y.o. female presenting with abdominal pain, vomiting, and diarrhea. The history is provided by the patient.  Abdominal Pain Pain location:  LUQ and LLQ Pain quality: sharp   Pain radiates to:  Does not radiate Pain severity now: 10/10. Onset quality:  Gradual Duration:  7 hours Timing:  Constant Progression:  Waxing and waning Chronicity:  Recurrent Associated symptoms: diarrhea (x4, started at 1400), flatus, nausea and vomiting (x3)   Associated symptoms: no anorexia, no chest pain, no chills, no constipation, no cough, no dysuria, no fever, no hematemesis, no hematochezia, no hematuria, no shortness of breath, no vaginal bleeding and no vaginal discharge   Risk factors: being elderly and multiple surgeries   Emesis Associated symptoms: abdominal pain and diarrhea (x4, started at 1400)   Associated symptoms: no chills and no myalgias   Diarrhea Quality:  Semi-solid Severity:  Mild Number of episodes:  4 Timing:  Intermittent Progression:  Unchanged Associated symptoms: abdominal pain and vomiting (x3)   Associated symptoms: no chills, no diaphoresis, no fever and no myalgias   Risk factors: no recent antibiotic use, no sick contacts, no suspicious food intake and no travel to endemic areas    Had similar pain 1 month ago after intercourse but that pain resolved.  Would like pelvic exam.  Monogamous relationship. Not protected. Unsure what kind of hysterectomy she had in [place].   Past Medical History  Diagnosis Date  . Hypertension   . Hyperlipidemia   . GERD (gastroesophageal reflux disease)   . Obesity   . Exposure to TB     completed 9 months of INH and B6  . Osteopenia     DEXA 08/2003, T score lumbar -1.9  . History of cervical cancer     s/p hysterectomyy. Most  recent pap in 2009 normal   Past Surgical History  Procedure Laterality Date  . Eye surgery  2005    cataract surgery  . Abdominal hysterectomy  1998    In [place] "baby maker gone, playpen still there"  . Colonoscopy  2013    normal    Family History  Problem Relation Age of Onset  . Cancer Mother     died from brain tumor  . Cancer Father     died from liver cancer and cirrhosis  . Diabetes Brother   . Aneurysm Sister     died from brain aneurysm bleed  . Colon cancer Neg Hx   . Stomach cancer Neg Hx   . Rectal cancer Neg Hx   . Colon polyps Neg Hx    History  Substance Use Topics  . Smoking status: Never Smoker   . Smokeless tobacco: Never Used  . Alcohol Use: No   OB History   Grav Para Term Preterm Abortions TAB SAB Ect Mult Living                 Review of Systems  Constitutional: Negative for fever, chills and diaphoresis.  Respiratory: Negative for cough, shortness of breath and wheezing.   Cardiovascular: Negative for chest pain.  Gastrointestinal: Positive for nausea, vomiting (x3), abdominal pain, diarrhea (x4, started at 1400) and flatus. Negative for constipation, hematochezia, abdominal distention, rectal pain, anorexia and hematemesis.  Genitourinary: Negative  for dysuria, hematuria, flank pain, vaginal bleeding and vaginal discharge.  Musculoskeletal: Negative for back pain and myalgias.  Skin: Negative for rash.  All other systems reviewed and are negative.   Allergies  Review of patient's allergies indicates no known allergies.  Home Medications   Prior to Admission medications   Medication Sig Start Date End Date Taking? Authorizing Provider  amLODipine (NORVASC) 10 MG tablet Take 1 tablet (10 mg total) by mouth daily.  09/23/14  Othella Boyer, MD  aspirin 81 MG tablet Take 81 mg by mouth daily.      Historical Provider, MD  calcium carbonate (TUMS) 500 MG chewable tablet Chew 2 tablets (400 mg of elemental calcium total) by mouth 3 (three) times  daily with meals. 11/01/13 11/01/14  Neema Bobbie Stack, MD  diphenhydrAMINE (SOMINEX) 25 MG tablet Take 50 mg by mouth at bedtime as needed. Patient uses this for sleep    Historical Provider, MD  hydrochlorothiazide (HYDRODIURIL) 25 MG tablet Take 1 tablet (25 mg total) by mouth daily.    Othella Boyer, MD  lisinopril (PRINIVIL,ZESTRIL) 40 MG tablet Take 1 tablet (40 mg total) by mouth daily.    Othella Boyer, MD   BP 130/77  Pulse 71  Temp(Src) 98.2 F (36.8 C) (Oral)  Resp 13  SpO2 98%  LMP 02/23/1997 Physical Exam  Nursing note and vitals reviewed. Constitutional: She is oriented to person, place, and time. She appears well-developed and well-nourished. No distress.  Comfortable appearing  HENT:  Head: Normocephalic and atraumatic.  Nose: Nose normal.  Eyes: Conjunctivae are normal. Pupils are equal, round, and reactive to light.  Neck: Normal range of motion. Neck supple. No tracheal deviation present.  Cardiovascular: Normal rate, regular rhythm and normal heart sounds.   No murmur heard. Pulmonary/Chest: Effort normal and breath sounds normal. No respiratory distress. She has no rales.  Abdominal: Soft. Bowel sounds are normal. She exhibits no distension and no mass. There is tenderness (minimal and only to deep palpation of lower quadrants). There is no rebound and no guarding.  Neg CVAT, obturator, psoas  Musculoskeletal: Normal range of motion. She exhibits no edema.  Neurological: She is alert and oriented to person, place, and time.  Skin: Skin is warm and dry. No rash noted.  Psychiatric: She has a normal mood and affect.     ED Course  Procedures (including critical care time) Labs Review Labs Reviewed  WET PREP, GENITAL - Abnormal; Notable for the following:    Clue Cells Wet Prep HPF POC FEW (*)    WBC, Wet Prep HPF POC FEW (*)    All other components within normal limits  CBC WITH DIFFERENTIAL - Abnormal; Notable for the following:    Neutrophils Relative % 84  (*)    All other components within normal limits  COMPREHENSIVE METABOLIC PANEL - Abnormal; Notable for the following:    Sodium 136 (*)    Potassium 3.3 (*)    Glucose, Bld 107 (*)    Total Protein 9.0 (*)    GFR calc non Af Amer 90 (*)    All other components within normal limits  URINALYSIS, ROUTINE W REFLEX MICROSCOPIC - Abnormal; Notable for the following:    Hgb urine dipstick MODERATE (*)    Ketones, ur 40 (*)    Protein, ur 100 (*)    All other components within normal limits  URINE MICROSCOPIC-ADD ON - Abnormal; Notable for the following:    Squamous Epithelial / LPF  MANY (*)    Bacteria, UA FEW (*)    All other components within normal limits  GC/CHLAMYDIA PROBE AMP  LIPASE, BLOOD    Imaging Review Ct Renal Stone Study  03/19/2014   CLINICAL DATA:  Lower abdominal pain, nausea and vomiting.  EXAM: CT ABDOMEN AND PELVIS WITHOUT CONTRAST  TECHNIQUE: Multidetector CT imaging of the abdomen and pelvis was performed following the standard protocol without IV contrast.  COMPARISON:  None.  FINDINGS: No evidence of renal calculi, ureteral calculi or obstruction. The bladder is unremarkable.  There is evidence of prior pelvic surgery with multiple clips present as well as evidence of prior hysterectomy. A few distal small bowel loops in the right pelvis show dilatation and contain fluid. Maximal small bowel caliber at this level is approximately 2.7 cm. Some of these small bowel loops contain fecal appearing material and findings are suspicious for segmental partial obstruction versus ileus. There is no evidence of hernia. A small amount of free fluid is seen adjacent to small bowel loops in the pelvis.  No evidence of pneumatosis or free air. No focal abscess identified. The colon is unremarkable. No focal masses or enlarged lymph nodes. Atherosclerosis of the abdominal aorta noted without aneurysm. Mild degenerative disease of the spine present.  IMPRESSION: There are some prominent  distal small bowel loops in the right pelvis with findings suspicious for either segmental partial obstruction or ileus. This could be on the basis of regional adhesions due to evidence of prior pelvic surgery. No hernia evident. No evidence of bowel perforation, pneumatosis or abscess.   Electronically Signed   By: Aletta Edouard M.D.   On: 03/19/2014 22:10     EKG Interpretation None      MDM   Final diagnoses:  Nausea vomiting and diarrhea    Pt presents with lower abdominal pain of gradual onset earlier today. VSS, well appearing. No signs of peritonitis or systemic toxicity to suggest surgical emergency.   No urinary symptoms, UA without infx markers.  No indication for kidney stone.  Comfortable appearing.  Doubt appy.  Requests eplvic after intercourse one month ago with similar sx.  N/v/d of similar onset, may be related to gastroenteritis.  UA positive for blood, sent for CT stone study. Refused analgesia.   10:48 PM CT negative for calculi.  May have passed stone.  Pain improved after ibuprofen.  CT with possible partial obstruction vs ileus but does not correlate clinically.   Discussed strict return precaution. Pt agrees with dispo plan for discharge. Will see PCP this week.   Tammy Sours, MD 03/19/14 2352

## 2014-03-19 NOTE — ED Notes (Signed)
Pt requesting mild pain medication. MD informed.

## 2014-03-19 NOTE — ED Notes (Signed)
Pt states she does not want an IV or pain medicine, pt reports she would like nausea medicine orally.

## 2014-03-19 NOTE — ED Notes (Signed)
Pt presents to department for evaluation of lower abdominal pain and N/V. 10/10 pain upon arrival to ED. Last BM today was normal. Pt is alert and oriented x4.

## 2014-03-20 LAB — GC/CHLAMYDIA PROBE AMP
CT Probe RNA: NEGATIVE
GC Probe RNA: NEGATIVE

## 2014-03-22 NOTE — ED Provider Notes (Signed)
I saw and evaluated the patient, reviewed the resident's note and I agree with the findings and plan.   .Face to face Exam:  General:  Awake HEENT:  Atraumatic Resp:  Normal effort Abd:  Nondistended Neuro:No focal weakness  Dot Lanes, MD 03/22/14 [phone]

## 2014-04-15 ENCOUNTER — Other Ambulatory Visit: Payer: Self-pay | Admitting: *Deleted

## 2014-04-16 MED ORDER — HYDROCHLOROTHIAZIDE 25 MG PO TABS: 25.0000 mg | ORAL_TABLET | Freq: Every day | ORAL | Status: DC

## 2014-04-16 MED ORDER — AMLODIPINE BESYLATE 10 MG PO TABS: 10.0000 mg | ORAL_TABLET | Freq: Every day | ORAL | Status: DC

## 2014-04-16 MED ORDER — LISINOPRIL 40 MG PO TABS: 40.0000 mg | ORAL_TABLET | Freq: Every day | ORAL | Status: DC

## 2014-09-08 ENCOUNTER — Other Ambulatory Visit: Payer: Self-pay | Admitting: *Deleted

## 2014-09-08 MED ORDER — AMLODIPINE BESYLATE 10 MG PO TABS
10.0000 mg | ORAL_TABLET | Freq: Every day | ORAL | Status: DC
Start: 2014-09-08 — End: 2014-11-03

## 2014-09-08 MED ORDER — DIPHENHYDRAMINE HCL (SLEEP) 25 MG PO TABS: 50.0000 mg | ORAL_TABLET | Freq: Every day | ORAL | Status: DC

## 2014-09-08 MED ORDER — HYDROCHLOROTHIAZIDE 25 MG PO TABS: 25.0000 mg | ORAL_TABLET | Freq: Every day | ORAL | Status: DC

## 2014-09-08 MED ORDER — LISINOPRIL 40 MG PO TABS: 40.0000 mg | ORAL_TABLET | Freq: Every day | ORAL | Status: DC

## 2014-09-08 NOTE — Telephone Encounter (Signed)
Pt asking for 90 day supply, please.

## 2014-11-03 ENCOUNTER — Ambulatory Visit (INDEPENDENT_AMBULATORY_CARE_PROVIDER_SITE_OTHER): Payer: Medicare HMO | Admitting: Internal Medicine

## 2014-11-03 VITALS — BP 143/90 | HR 67 | Temp 98.2°F | Wt 189.6 lb

## 2014-11-03 DIAGNOSIS — I10 Essential (primary) hypertension: Secondary | ICD-10-CM

## 2014-11-03 DIAGNOSIS — Z0000 Encounter for general adult medical examination without abnormal findings: Secondary | ICD-10-CM | POA: Diagnosis not present

## 2014-11-03 DIAGNOSIS — E785 Hyperlipidemia, unspecified: Secondary | ICD-10-CM

## 2014-11-03 DIAGNOSIS — M8580 Other specified disorders of bone density and structure, unspecified site: Secondary | ICD-10-CM

## 2014-11-03 DIAGNOSIS — G479 Sleep disorder, unspecified: Secondary | ICD-10-CM

## 2014-11-03 LAB — BASIC METABOLIC PANEL WITH GFR
BUN: 14 mg/dL (ref 6–23)
CALCIUM: 10.2 mg/dL (ref 8.4–10.5)
CHLORIDE: 100 meq/L (ref 96–112)
CO2: 29 mEq/L (ref 19–32)
Creat: 0.61 mg/dL (ref 0.50–1.10)
GFR, Est African American: >89 mL/min
Glucose, Bld: 98 mg/dL (ref 70–99)
POTASSIUM: 3.8 meq/L (ref 3.5–5.3)
SODIUM: 140 meq/L (ref 135–145)

## 2014-11-03 LAB — CBC
HEMATOCRIT: 40.3 % (ref 36.0–46.0)
Hemoglobin: 13.8 g/dL (ref 12.0–15.0)
MCH: 29.2 pg (ref 26.0–34.0)
MCHC: 34.2 g/dL (ref 30.0–36.0)
MCV: 85.4 fL (ref 78.0–100.0)
MPV: 10.4 fL (ref 8.6–12.4)
Platelets: 248 10*3/uL (ref 150–400)
RBC: 4.72 MIL/uL (ref 3.87–5.11)
RDW: 14.2 % (ref 11.5–15.5)
WBC: 6.1 10*3/uL (ref 4.0–10.5)

## 2014-11-03 LAB — LIPID PANEL
CHOLESTEROL: 265 mg/dL — AB (ref 0–200)
HDL: 62 mg/dL (ref >=46)
LDL Cholesterol: 180 mg/dL — ABNORMAL HIGH (ref 0–99)
Total CHOL/HDL Ratio: 4.3 Ratio
Triglycerides: 116 mg/dL (ref <150)
VLDL: 23 mg/dL (ref 0–40)

## 2014-11-03 MED ORDER — DIPHENHYDRAMINE HCL (SLEEP) 25 MG PO TABS: 50.0000 mg | ORAL_TABLET | Freq: Every day | ORAL | Status: DC

## 2014-11-03 MED ORDER — ZOSTER VACCINE LIVE 19400 UNT/0.65ML ~~LOC~~ SOLR: 0.6500 mL | Freq: Once | SUBCUTANEOUS | Status: DC

## 2014-11-03 MED ORDER — LISINOPRIL 40 MG PO TABS: 40.0000 mg | ORAL_TABLET | Freq: Every day | ORAL | Status: DC

## 2014-11-03 MED ORDER — VITAMIN D3 1.25 MG (50000 UT) PO CAPS: 1.0000 | ORAL_CAPSULE | ORAL | Status: DC

## 2014-11-03 MED ORDER — HYDROCHLOROTHIAZIDE 25 MG PO TABS: 25.0000 mg | ORAL_TABLET | Freq: Every day | ORAL | Status: DC

## 2014-11-03 MED ORDER — AMLODIPINE BESYLATE 10 MG PO TABS: 10.0000 mg | ORAL_TABLET | Freq: Every day | ORAL | Status: DC

## 2014-11-03 NOTE — Patient Instructions (Signed)
Thank you for bringing in your medications with you today.  You can stop taking Asprin daily.  Start taking vit D 50,000 units once a week for 6 weeks then start taking vit D/calcium multivitamin. If you have constipation with calcium you can try over the counter colace.   As we discussed today, BMI less than 25 is ideal, your BMI is 30.8. Try to work on exercise and a healthier diet. We will recheck your weight and cholesterol reassess starting a cholesterol medicaiton.   It was a pleasure meeting you today. Please call the clinic if you have any questions!

## 2014-11-03 NOTE — Progress Notes (Signed)
   Subjective:    Patient ID: Sherri Delgado, female    DOB: 30-Nov-1945, 69 y.o.   MRN: [id number]  HPI. Sherri Delgado is here for a preventative care appt and understands that acute and chronic medical issues will be addressed at another appt. The following items have been assessed and recorded in the appropriate area in the EMR.   Height, weight, BMI, and BP  Preventative care services discussion Medical and family history were reviewed and updated  Medication reconciliation  Personalized health advice, risky behaviors, and treatment advice  Please see the assessment and plan for further information.      Review of Systems  Constitutional: Negative for fever and activity change.  HENT: Negative for sore throat, trouble swallowing and voice change.   Eyes: Negative for visual disturbance.       Floaters   Respiratory: Negative for cough and shortness of breath.   Cardiovascular: Negative for chest pain and palpitations.  Gastrointestinal: Negative for vomiting, abdominal pain, diarrhea and blood in stool.  Endocrine: Negative for polydipsia and polyuria.  Genitourinary: Negative for dysuria and vaginal discharge.  Musculoskeletal: Negative for back pain and neck stiffness.  Skin: Negative for rash.  Allergic/Immunologic: Negative for food allergies.  Neurological: Negative for weakness and headaches.  Hematological: Bruises/bleeds easily.  Psychiatric/Behavioral: Negative for dysphoric mood.       Objective:   Physical Exam  Constitutional: She is oriented to person, place, and time. She appears well-developed and well-nourished.  HENT:  Head: Normocephalic.  Eyes: Pupils are equal, round, and reactive to light.  Cardiovascular: Normal rate and regular rhythm.   Pulmonary/Chest: Effort normal and breath sounds normal. She has no wheezes.  Abdominal: Soft. Bowel sounds are normal.  Musculoskeletal:  Palpable left bakers cyst, non tender  Neurological: She is alert and  oriented to person, place, and time.  Skin: Skin is warm and dry.  Psychiatric: She has a normal mood and affect.          Assessment & Plan:  Please see problem based assessment and plan.

## 2014-11-04 DIAGNOSIS — Z0000 Encounter for general adult medical examination without abnormal findings: Secondary | ICD-10-CM | POA: Insufficient documentation

## 2014-11-04 LAB — HEPATITIS C ANTIBODY: HCV AB: NEGATIVE

## 2014-11-04 NOTE — Assessment & Plan Note (Signed)
Sherri Delgado presents for a preventative care appt. She has no complaints, requesting year supply of all medications. Spoke with patient at length about risk hyperlipidemia and 10 year ASCVD risk >7.5%. Pt would like to try lifestyle modifications first. Discussed need for weight loss and exercise. Pt's BMI is 30.8 and she was advised that to reach a BMI of 26 she would need to lose around 20lbs.   - Hep C ab negative, no hx of blood transfusions.  - lipid panel pending - BMET - CBC - pt has hx of cervical cancer s/p hysterectomy, she has had 3 negative pap smears within the past 10 years, she does not need a pap smear any longer - she had a DEXA in 2011 significant for osteopenia, Vit D 11/2013 was 17. Started on Vit D3 50,000 q7 days x 6 weeks, then instructed to take combined Vit D/ Calcium supplement. Ordered repeat DEXA scan.  - instructed to continue home calcium and take OTC colace as needed for constipation.  - rx for zoster vaccine - hbA1c 5.5 in 2014, will recheck hbA1c next year.  - negative colonoscopy in 2013, next one due 2023 - neg mammogram 2015, next one due 2017 - discussed medications, taken off asa due to low intermediate risk per CV/GI ASA risk calculator.  - refilled norvasc, HCTZ, lisinopril, and sominex

## 2014-11-04 NOTE — Progress Notes (Signed)
INTERNAL MEDICINE TEACHING ATTENDING ADDENDUM - Aldine Contes, MD: I reviewed and discussed at the time of visit with the resident Dr. Hulen Luster, the patient's medical history, physical examination, diagnosis and results of pertinent tests and treatment and I agree with the patient's care as documented.

## 2015-05-19 ENCOUNTER — Encounter: Payer: Self-pay | Admitting: Internal Medicine

## 2015-05-19 ENCOUNTER — Ambulatory Visit (INDEPENDENT_AMBULATORY_CARE_PROVIDER_SITE_OTHER): Payer: Medicare HMO | Admitting: Internal Medicine

## 2015-05-19 VITALS — BP 133/78 | HR 60 | Temp 98.2°F | Ht 64.0 in | Wt 202.9 lb

## 2015-05-19 DIAGNOSIS — N644 Mastodynia: Secondary | ICD-10-CM

## 2015-05-19 NOTE — Assessment & Plan Note (Signed)
Patient presents with complaint of right breast pain located around the nipple which feels like "pins and needles." Pain started 3 days ago, last for one day, and resolved on its own. She denied any skin changes, nipple discharge, prior history of breast pain or breast malignancy, new bras, new activities. She denies any weight loss. She has chronic night sweats as she is s/p hysterectomy from cervical cancer. Her last mammogram was July 2015 and normal, repeat one year. No skin changes or nipple discharge noted on examination. Pain was likely secondary to skin irritation versus nerve pain. No evidence of rash such as shingles. Breast exam was notable for 2 possible nodules as noted in my physical exam- like fibrous tissue, but I cannot rule out malignancy.  Plan: -Diagnostic mammogram schedule for 05/22/15

## 2015-05-19 NOTE — Progress Notes (Signed)
Subjective:    Patient ID: Sherri Delgado, female    DOB: [birth year], 69 y.o.   MRN: [id number]  HPI Sherri Delgado is a 69 y.o. female with PMHx of cervical cancer s/p hysterectomy and HTN who presents to the clinic for right breast pain. Please see A&P for the status of the patient's chronic medical problems.   Past Medical History  Diagnosis Date  . Hypertension   . Hyperlipidemia   . GERD (gastroesophageal reflux disease)   . Obesity   . Exposure to TB     completed 9 months of INH and B6  . Osteopenia     DEXA 08/2003, T score lumbar -1.9  . History of cervical cancer     s/p hysterectomyy. Most recent pap in 2009 normal    Outpatient Encounter Prescriptions as of 05/19/2015  Medication Sig  . amLODipine (NORVASC) 10 MG tablet Take 1 tablet (10 mg total) by mouth daily.  . Cholecalciferol (VITAMIN D3) 50000 UNITS CAPS Take 1 tablet by mouth every 7 (seven) days.  . diphenhydrAMINE (SOMINEX) 25 MG tablet Take 2 tablets (50 mg total) by mouth at bedtime. Patient uses this for sleep every night per patient  . hydrochlorothiazide (HYDRODIURIL) 25 MG tablet Take 1 tablet (25 mg total) by mouth daily.  Marland Kitchen lisinopril (PRINIVIL,ZESTRIL) 40 MG tablet Take 1 tablet (40 mg total) by mouth daily.  Marland Kitchen zoster vaccine live, PF, (ZOSTAVAX) 09811 UNT/0.65ML injection Inject 19,400 Units into the skin once.   No facility-administered encounter medications on file as of 05/19/2015.    Family History  Problem Relation Age of Onset  . Cancer Mother     died from brain tumor  . Cancer Father     died from liver cancer and cirrhosis  . Diabetes Brother   . Aneurysm Sister     died from brain aneurysm bleed  . Colon cancer Neg Hx   . Stomach cancer Neg Hx   . Rectal cancer Neg Hx   . Colon polyps Neg Hx     Social History   Social History  . Marital Status: Widowed    Spouse Name: N/A  . Number of Children: N/A  . Years of Education: N/A   Occupational History  . Not on file.    Social History Main Topics  . Smoking status: Never Smoker   . Smokeless tobacco: Never Used  . Alcohol Use: No  . Drug Use: No  . Sexual Activity: Not on file   Other Topics Concern  . Not on file   Social History Narrative   Never smoked, no alcohol or drug use.   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    Review of Systems General: Admits to night sweats (chronic). Denies fatigue, weight loss.  Respiratory: Denies SOB, cough Cardiovascular: Denies chest pain and palpitations.  Breast: Admits to right breast pain and left axilla bruising. Gastrointestinal: Denies nausea, vomiting, abdominal pain, diarrhea Musculoskeletal: Denies myalgias Skin: Denies rash and wounds.  Neurological: Denies dizziness, headaches, weakness, lightheadedness, numbness    Objective:   Physical Exam Filed Vitals:   05/19/15 0844  BP: 133/78  Pulse: 60  Temp: 98.2 F (36.8 C)  TempSrc: Oral  Height: 5\' 4"  (1.626 m)  Weight: 202 lb 14.4 oz (92.035 kg)  SpO2: 98%   General: Vital signs reviewed.  Patient is well-developed and well-nourished, in no acute distress and cooperative with exam.  Cardiovascular: RRR, S1 normal, S2 normal, no murmurs, gallops, or rubs.  Pulmonary/Chest: Clear to auscultation bilaterally, no wheezes, rales, or rhonchi. Breasts: Right breast without skin or nipple changes or axillary nodes, left breast without skin or nipple changes or axillary nodes, abnormal masses palpable in right breast inner lower quadrant 1 cm in size and left breast outer lower quadrant 1 cm in size, non-tender. Likely fibrous tissue but cannot exclude malignancy. Hyperpigmentation in left axilla. No rashes. Extremities: No lower extremity edema bilaterally Skin: Warm, dry and intact. No rashes or erythema. Psychiatric: Normal mood and affect. speech and behavior is normal. Cognition and memory are normal.     Assessment & Plan:   Please see problem based  assessment and plan.

## 2015-05-19 NOTE — Patient Instructions (Signed)
We will call you with the appointment for the mammogram.

## 2015-05-22 ENCOUNTER — Ambulatory Visit
Admission: RE | Admit: 2015-05-22 | Discharge: 2015-05-22 | Disposition: A | Discharge status: Home / Self Care | Payer: Medicare HMO | Source: Ambulatory Visit | Attending: Internal Medicine | Admitting: Internal Medicine

## 2015-05-22 DIAGNOSIS — N644 Mastodynia: Secondary | ICD-10-CM

## 2015-05-22 DIAGNOSIS — R928 Other abnormal and inconclusive findings on diagnostic imaging of breast: Secondary | ICD-10-CM | POA: Diagnosis not present

## 2015-05-22 DIAGNOSIS — N6489 Other specified disorders of breast: Secondary | ICD-10-CM | POA: Diagnosis not present

## 2015-05-27 NOTE — Progress Notes (Signed)
Internal Medicine Clinic Attending  Case discussed with Dr. Richardson at the time of the visit.  We reviewed the resident's history and exam and pertinent patient test results.  I agree with the assessment, diagnosis, and plan of care documented in the resident's note. 

## 2015-06-22 ENCOUNTER — Other Ambulatory Visit: Payer: Self-pay | Admitting: Internal Medicine

## 2015-09-22 ENCOUNTER — Other Ambulatory Visit: Payer: Self-pay | Admitting: Internal Medicine

## 2015-09-23 NOTE — Telephone Encounter (Signed)
Spoke with patient this am.  She agreed to an appt on 10-07-15 @ 10:45 am.

## 2015-09-23 NOTE — Telephone Encounter (Signed)
Last appt 05/19/15.  No f/u appt scheduled.

## 2015-09-28 ENCOUNTER — Other Ambulatory Visit: Payer: Self-pay | Admitting: Internal Medicine

## 2015-09-29 NOTE — Telephone Encounter (Signed)
Last seen 05/19/15. Next appt is 10/07/15 with Dr Hulen Luster.

## 2015-10-07 ENCOUNTER — Ambulatory Visit (INDEPENDENT_AMBULATORY_CARE_PROVIDER_SITE_OTHER): Payer: Medicare HMO | Admitting: Internal Medicine

## 2015-10-07 ENCOUNTER — Encounter: Payer: Self-pay | Admitting: Internal Medicine

## 2015-10-07 VITALS — BP 148/79 | HR 62 | Temp 97.8°F | Ht 60.0 in | Wt 203.6 lb

## 2015-10-07 DIAGNOSIS — I10 Essential (primary) hypertension: Secondary | ICD-10-CM

## 2015-10-07 DIAGNOSIS — Z23 Encounter for immunization: Secondary | ICD-10-CM

## 2015-10-07 DIAGNOSIS — M8580 Other specified disorders of bone density and structure, unspecified site: Secondary | ICD-10-CM | POA: Diagnosis not present

## 2015-10-07 DIAGNOSIS — Z0000 Encounter for general adult medical examination without abnormal findings: Secondary | ICD-10-CM

## 2015-10-07 DIAGNOSIS — E669 Obesity, unspecified: Secondary | ICD-10-CM

## 2015-10-07 LAB — POCT GLYCOSYLATED HEMOGLOBIN (HGB A1C): HEMOGLOBIN A1C: 5.8

## 2015-10-07 LAB — GLUCOSE, CAPILLARY: GLUCOSE-CAPILLARY: 90 mg/dL (ref 65–99)

## 2015-10-07 MED ORDER — AMLODIPINE BESYLATE 10 MG PO TABS: 10.0000 mg | ORAL_TABLET | Freq: Every day | ORAL | Status: DC

## 2015-10-07 MED ORDER — ZOSTER VACCINE LIVE 19400 UNT/0.65ML ~~LOC~~ SOLR: 0.6500 mL | Freq: Once | SUBCUTANEOUS | Status: DC

## 2015-10-07 MED ORDER — LISINOPRIL 40 MG PO TABS: 40.0000 mg | ORAL_TABLET | Freq: Every day | ORAL | Status: DC

## 2015-10-07 MED ORDER — HYDROCHLOROTHIAZIDE 25 MG PO TABS: 25.0000 mg | ORAL_TABLET | Freq: Every day | ORAL | Status: DC

## 2015-10-07 NOTE — Assessment & Plan Note (Addendum)
BP Readings from Last 3 Encounters:  10/07/15 148/79  05/19/15 133/78  11/03/14 143/90    Lab Results  Component Value Date   NA 140 11/03/2014   K 3.8 11/03/2014   CREATININE 0.61 11/03/2014    Assessment: Blood pressure control:  controlled  Progress toward BP goal:   near goal Comments: on norvasc 10, HCTZ 25, and lisinopril 40mg   Plan: Medications:  continue current medications Educational resources provided: handout Self management tools provided:   Other plans: f/u in 1 year for BP check. Can consider tribenzor at next visit to cut down on amt of pills she has to take. Checking BMET today.

## 2015-10-07 NOTE — Assessment & Plan Note (Signed)
Checking Vit D level today. She completed 7 week course of Vit D 50,000 units weekly.

## 2015-10-07 NOTE — Progress Notes (Signed)
Internal Medicine Clinic Attending  Case discussed with Dr. Hulen Luster at the time of the visit.  We reviewed the resident's history and exam and pertinent patient test results.  I agree with the assessment, diagnosis, and plan of care documented in the resident's note.  One correction: patient presented for chief complaint of hypertension

## 2015-10-07 NOTE — Progress Notes (Signed)
Subjective:   Patient ID: Sherri Delgado female   DOB: 12-Aug-1945 70 y.o.   MRN: [id number]  HPI: [name] is a 70 y.o. with past medical history as outlined below who presents to clinic for medication refills.  Please see problem list for status of the pt's chronic medical problems.  Past Medical History  Diagnosis Date  . Hypertension   . Hyperlipidemia   . GERD (gastroesophageal reflux disease)   . Obesity   . Exposure to TB     completed 9 months of INH and B6  . Osteopenia     DEXA 08/2003, T score lumbar -1.9  . History of cervical cancer     s/p hysterectomyy. Most recent pap in 2009 normal   Current Outpatient Prescriptions  Medication Sig Dispense Refill  . amLODipine (NORVASC) 10 MG tablet Take 1 tablet (10 mg total) by mouth daily. 90 tablet 4  . diphenhydrAMINE (SOMINEX) 25 MG tablet Take 2 tablets (50 mg total) by mouth at bedtime. Patient uses this for sleep every night per patient 180 tablet 4  . hydrochlorothiazide (HYDRODIURIL) 25 MG tablet Take 1 tablet (25 mg total) by mouth daily. 90 tablet 4  . lisinopril (PRINIVIL,ZESTRIL) 40 MG tablet Take 1 tablet (40 mg total) by mouth daily. 90 tablet 4  . zoster vaccine live, PF, (ZOSTAVAX) 16109 UNT/0.65ML injection Inject 19,400 Units into the skin once. 1 each 0   No current facility-administered medications for this visit.   Family History  Problem Relation Age of Onset  . Cancer Mother     died from brain tumor  . Cancer Father     died from liver cancer and cirrhosis  . Diabetes Brother   . Aneurysm Sister     died from brain aneurysm bleed  . Colon cancer Neg Hx   . Stomach cancer Neg Hx   . Rectal cancer Neg Hx   . Colon polyps Neg Hx    Social History   Social History  . Marital Status: Widowed    Spouse Name: N/A  . Number of Children: N/A  . Years of Education: N/A   Social History Main Topics  . Smoking status: Never Smoker   . Smokeless tobacco: Never Used  . Alcohol Use: No  .  Drug Use: No  . Sexual Activity: Not Asked   Other Topics Concern  . None   Social History Narrative   Never smoked, no alcohol or drug use.   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   Review of Systems: Review of Systems  Constitutional: Negative.   Respiratory: Negative for shortness of breath.   Cardiovascular: Negative for chest pain.  Gastrointestinal: Negative for abdominal pain.    Objective:  Physical Exam: Filed Vitals:   10/07/15 0928  BP: 148/79  Pulse: 62  Temp: 97.8 F (36.6 C)  TempSrc: Oral  Height: 5' (1.524 m)  Weight: 203 lb 9.6 oz (92.352 kg)  SpO2: 100%   Physical Exam  Constitutional: She appears well-developed and well-nourished. No distress.  HENT:  Head: Normocephalic and atraumatic.  Eyes: Conjunctivae and EOM are normal.  Cardiovascular: Normal rate, regular rhythm and normal heart sounds.  Exam reveals no gallop and no friction rub.   No murmur heard. Pulmonary/Chest: Effort normal and breath sounds normal. No respiratory distress. She has no wheezes. She has no rales.  Abdominal: Soft. Bowel sounds are normal. She exhibits no distension. There is no tenderness. There is no rebound and no  guarding.  Skin: Skin is warm and dry. No rash noted. She is not diaphoretic. No erythema. No pallor.     Assessment & Plan:   Please see problem based assessment and plan.

## 2015-10-07 NOTE — Assessment & Plan Note (Signed)
Checking A1c today. Re ordered zostavax as she was not able to get this last time.

## 2015-10-08 LAB — BMP8+ANION GAP
ANION GAP: 19 mmol/L — AB (ref 10.0–18.0)
BUN / CREAT RATIO: 21 (ref 12–28)
BUN: 16 mg/dL (ref 8–27)
CALCIUM: 9.9 mg/dL (ref 8.7–10.3)
CHLORIDE: 98 mmol/L (ref 96–106)
CO2: 25 mmol/L (ref 18–29)
CREATININE: 0.77 mg/dL (ref 0.57–1.00)
GFR, EST AFRICAN AMERICAN: 92 mL/min/{1.73_m2} (ref >59)
GFR, EST NON AFRICAN AMERICAN: 80 mL/min/{1.73_m2} (ref >59)
GLUCOSE: 94 mg/dL (ref 65–99)
Potassium: 3.7 mmol/L (ref 3.5–5.2)
Sodium: 142 mmol/L (ref 134–144)

## 2015-10-08 LAB — VITAMIN D 25 HYDROXY (VIT D DEFICIENCY, FRACTURES): Vit D, 25-Hydroxy: 7.6 ng/mL — ABNORMAL LOW (ref 30.0–100.0)

## 2015-10-13 IMAGING — CT CT RENAL STONE PROTOCOL
2 of 4 series · 17 of 46 positions shown, 19 images · non-contrast
Comparison: None.

CLINICAL DATA: Lower abdominal pain, nausea and vomiting.

EXAM:
CT ABDOMEN AND PELVIS WITHOUT CONTRAST
TECHNIQUE: Multidetector CT imaging of the abdomen and pelvis was performed
following the standard protocol without IV contrast.

[Series 2: stone study 5.0 i30f 1 · axial · 0.92mm/px · z∈[+819,+1214]mm · 14 of 87 slices shown, 16 images]
[im 4/87  soft-tissue]
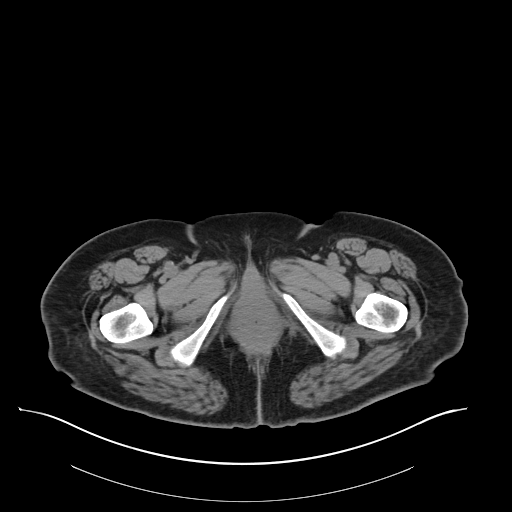
[im 4/87  bone]
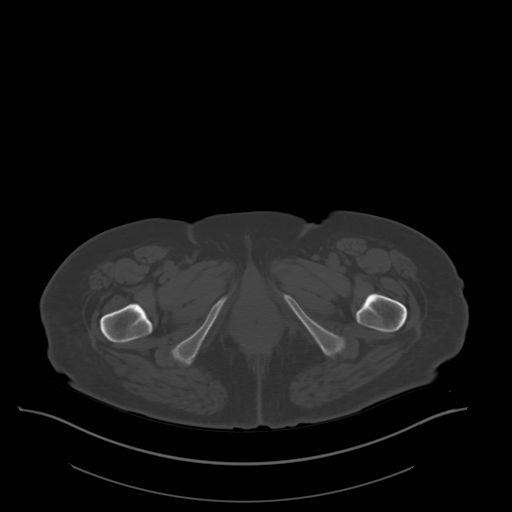
[im 12/87  soft-tissue]
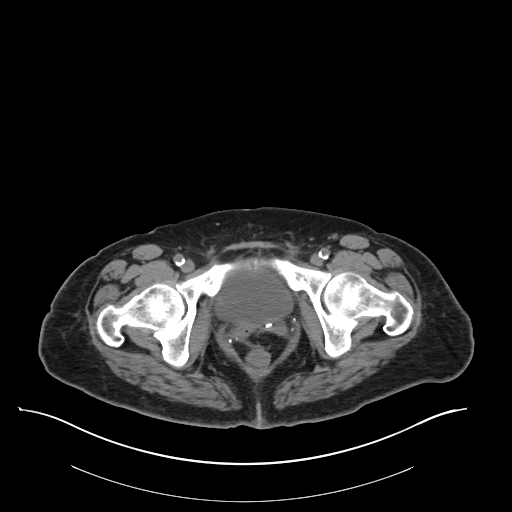
[im 15/87  soft-tissue]
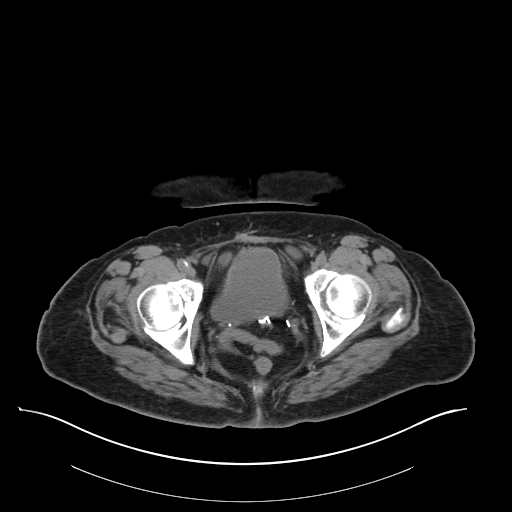
[im 23/87  soft-tissue]
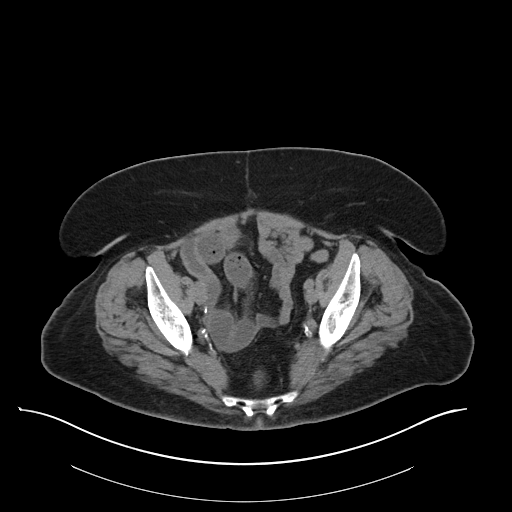
[im 30/87  soft-tissue]
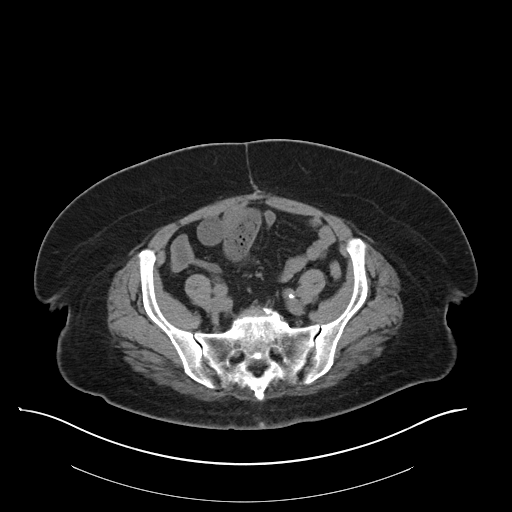
[im 34/87  soft-tissue]
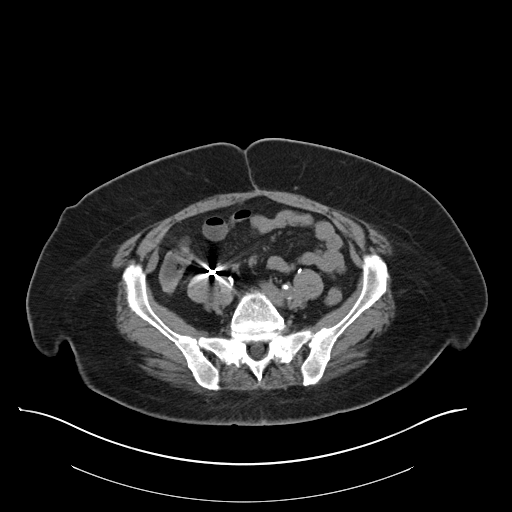
[im 42/87  soft-tissue]
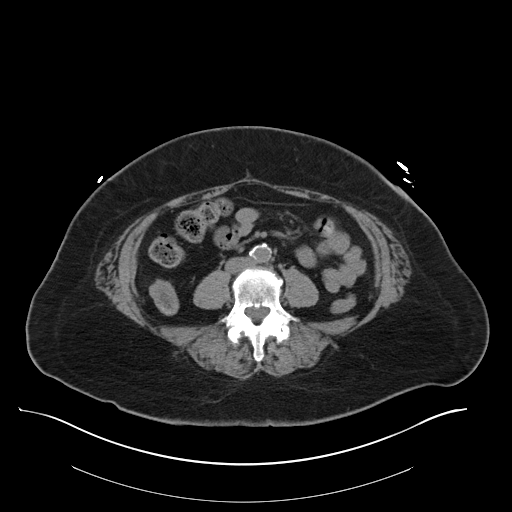
[im 45/87  soft-tissue]
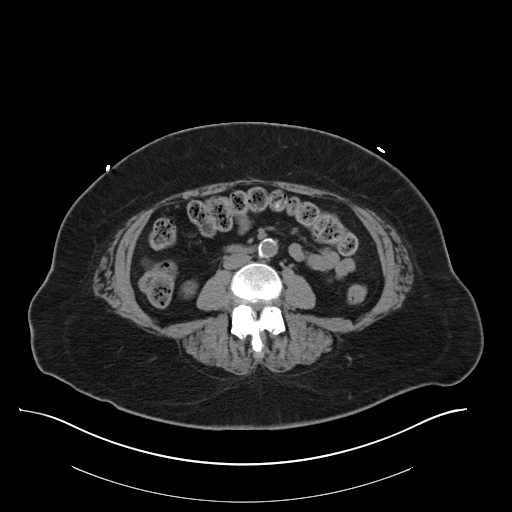
[im 53/87  soft-tissue]
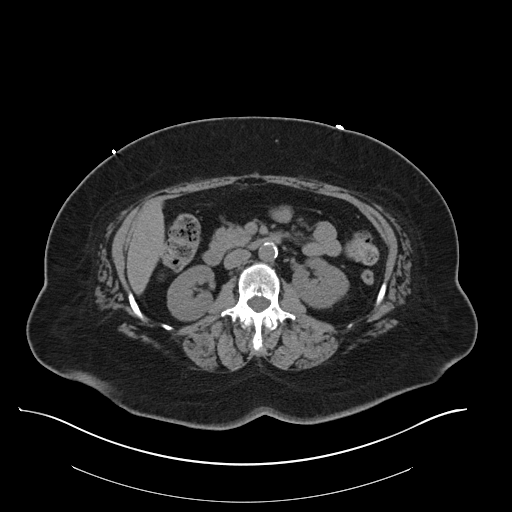
[im 53/87  bone]
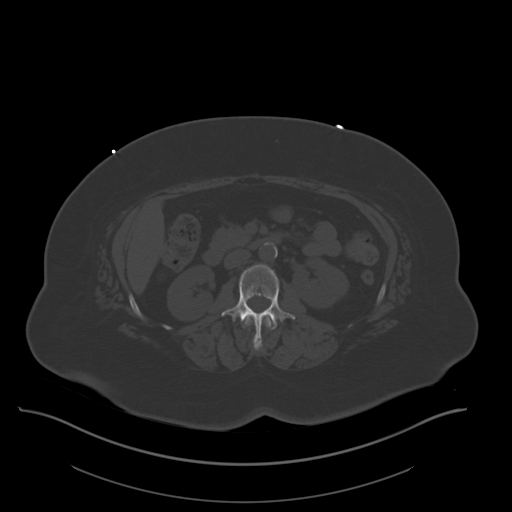
[im 57/87  soft-tissue]
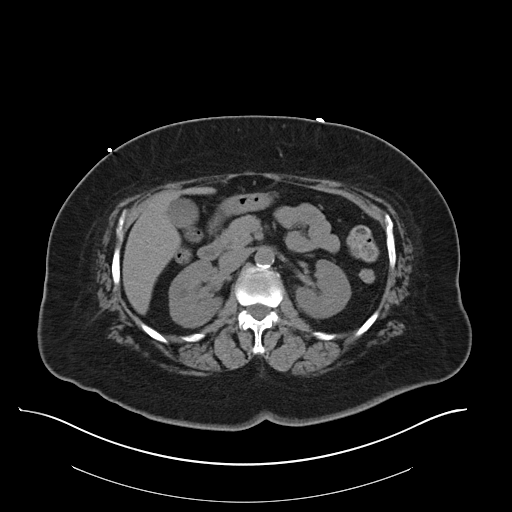
[im 64/87  soft-tissue]
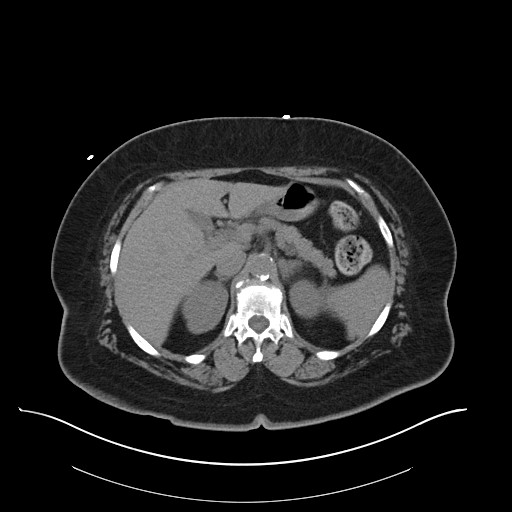
[im 72/87  soft-tissue]
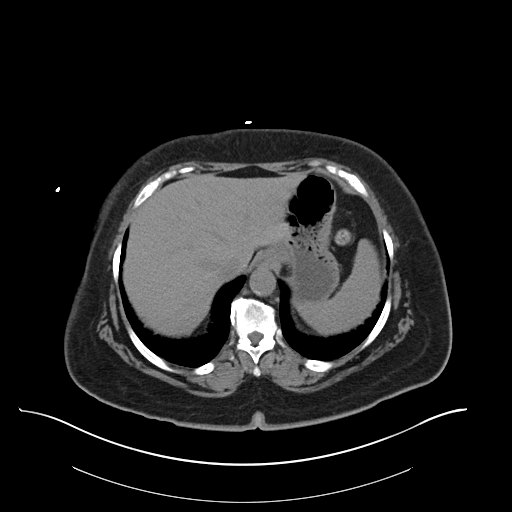
[im 75/87  soft-tissue]
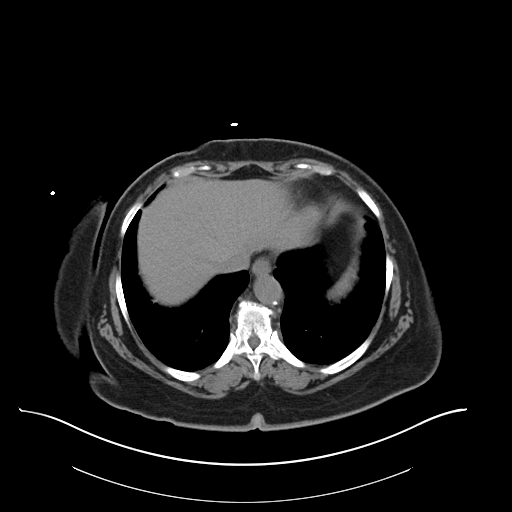
[im 83/87  soft-tissue]
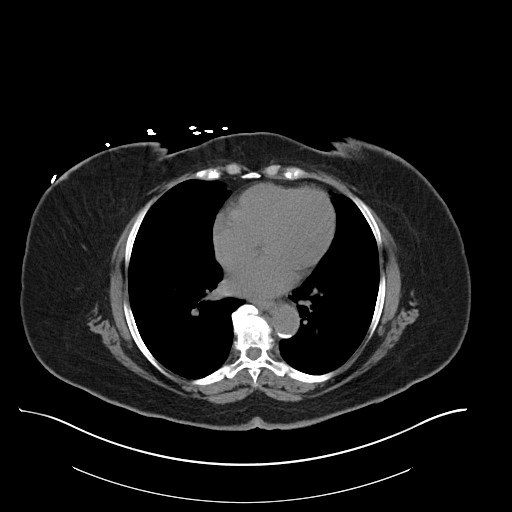

[Series 5: coronal soft tissue · coronal · 0.82mm/px · 3 of 77 slices shown]
[im 26/77  soft-tissue]
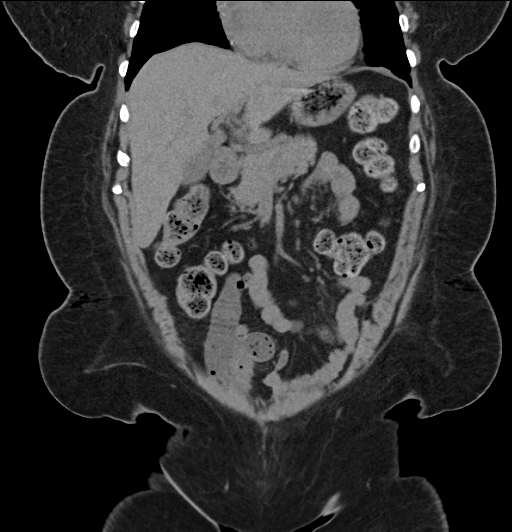
[im 34/77  soft-tissue]
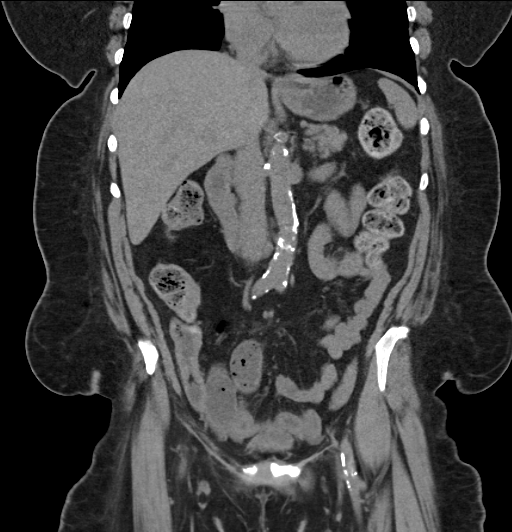
[im 43/77  soft-tissue]
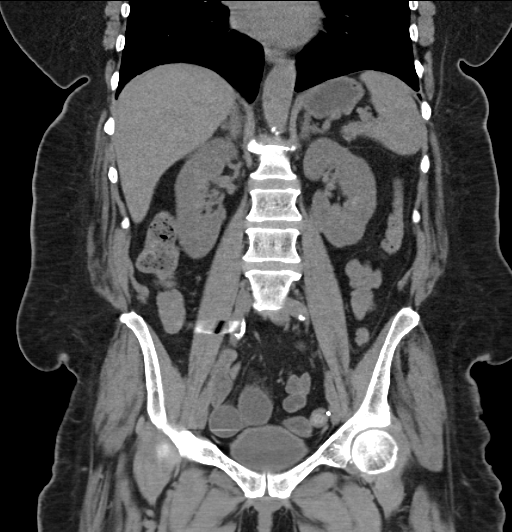

[17 of 46 positions shown; findings below may reference images not displayed]

FINDINGS: No evidence of renal calculi, ureteral calculi or obstruction. The
bladder is unremarkable.

There is evidence of prior pelvic surgery with multiple clips
present as well as evidence of prior hysterectomy. A few distal
small bowel loops in the right pelvis show dilatation and contain
fluid. Maximal small bowel caliber at this level is approximately
2.7 cm. Some of these small bowel loops contain fecal appearing
material and findings are suspicious for segmental partial
obstruction versus ileus. There is no evidence of hernia. A small
amount of free fluid is seen adjacent to small bowel loops in the
pelvis.

No evidence of pneumatosis or free air. No focal abscess identified.
The colon is unremarkable. No focal masses or enlarged lymph nodes.
Atherosclerosis of the abdominal aorta noted without aneurysm. Mild
degenerative disease of the spine present.
IMPRESSION: There are some prominent distal small bowel loops in the right
pelvis with findings suspicious for either segmental partial
obstruction or ileus. This could be on the basis of regional
adhesions due to evidence of prior pelvic surgery. No hernia
evident. No evidence of bowel perforation, pneumatosis or abscess.

## 2015-11-26 DIAGNOSIS — I10 Essential (primary) hypertension: Secondary | ICD-10-CM | POA: Diagnosis not present

## 2015-11-26 DIAGNOSIS — Z0000 Encounter for general adult medical examination without abnormal findings: Secondary | ICD-10-CM | POA: Diagnosis not present

## 2015-11-26 DIAGNOSIS — E7800 Pure hypercholesterolemia, unspecified: Secondary | ICD-10-CM | POA: Diagnosis not present

## 2016-02-11 DIAGNOSIS — M170 Bilateral primary osteoarthritis of knee: Secondary | ICD-10-CM | POA: Diagnosis not present

## 2016-03-10 DIAGNOSIS — M170 Bilateral primary osteoarthritis of knee: Secondary | ICD-10-CM | POA: Diagnosis not present

## 2016-03-31 ENCOUNTER — Ambulatory Visit (INDEPENDENT_AMBULATORY_CARE_PROVIDER_SITE_OTHER): Payer: Medicare HMO | Admitting: Internal Medicine

## 2016-03-31 DIAGNOSIS — N930 Postcoital and contact bleeding: Secondary | ICD-10-CM | POA: Diagnosis not present

## 2016-03-31 DIAGNOSIS — Z90710 Acquired absence of both cervix and uterus: Secondary | ICD-10-CM | POA: Diagnosis not present

## 2016-03-31 DIAGNOSIS — Z8541 Personal history of malignant neoplasm of cervix uteri: Secondary | ICD-10-CM

## 2016-03-31 DIAGNOSIS — Z923 Personal history of irradiation: Secondary | ICD-10-CM | POA: Diagnosis not present

## 2016-03-31 NOTE — Progress Notes (Addendum)
   CC: Post-coital vaginal bleeding  HPI:  [name] is a 70 y.o. female with PMHx detailed below presenting with post-coital vaginal bleeding (twice in last month).  See problem based assessment and plan below for additional details.  Past Medical History:  Diagnosis Date  . Exposure to TB    completed 9 months of INH and B6  . GERD (gastroesophageal reflux disease)   . History of cervical cancer    s/p hysterectomyy. Most recent pap in 2009 normal  . Hyperlipidemia   . Hypertension   . Obesity   . Osteopenia    DEXA 08/2003, T score lumbar -1.9    Review of Systems: Review of Systems  Constitutional: Negative for chills, fever, malaise/fatigue and weight loss.  Respiratory: Negative for shortness of breath.   Cardiovascular: Negative for chest pain.  Gastrointestinal: Negative for abdominal pain, blood in stool, constipation, diarrhea and nausea.  Genitourinary: Positive for urgency.     Physical Exam: Vitals:   03/31/16 0829  BP: (!) 148/70  Pulse: 70  Temp: 97.9 F (36.6 C)  TempSrc: Oral  SpO2: 98%  Weight: 204 lb (92.5 kg)  Height: 5\' 5"  (1.651 m)   Body mass index is 33.95 kg/m. GENERAL- Obese woman sitting comfortably in exam room chair, well-dressed, alert, in no distress HEENT- Atraumatic, EOMI, moist mucous membranes CARDIAC- Regular rate and rhythm, no murmurs, rubs or gallops. RESP- Clear to ascultation bilaterally, no wheezing or crackles, normal work of breathing ABDOMEN- Obese, soft, nontender, nondistended EXTREMITIES- Normal bulk and range of motion, no edema, 2+ peripheral pulses GENITAL/PELVIC- No external rash or lesion, no palpable lymphadenopathy, shallow vaginal depth noted with speculum exam, moist membranes with no visible lesion, no visible cervix, apex of vaginal cuff notable for telangiectasias and a small area which began oozing scant quantity of blood after being brushed with the end of the spectulum SKIN- Warm, dry, intact,  without visible rash PSYCH- Appropriate affect, clear speech, thoughts linear and goal-directed  Assessment & Plan:   See encounters tab for problem based medical decision making.  Patient seen with Dr. Dareen Piano

## 2016-03-31 NOTE — Assessment & Plan Note (Addendum)
Reports two episodes of post-coital bleeding - first episode was one month ago, and this occurred again 3 days ago with small amounts of vaginal bleeding and blood clots when she went to use the toilet. She had 2-3 sessions of intercourse in between with no noticeable post-coital bleeding. She has a remote history of cervical cancer (approx 20 yr ago) treated with radiation followed by total hysterectomy, no chemo. She had normal pap smears in 2009, 2014, 2015. She denies having particularly forceful or traumatic intercourse, but does mention that she feels some vaginal discomfort with sex "like it's in too far or pushing against the back." She adamantly denies vaginal dryness and states it never has been a problem for her. She denies weight loss, abdominal pain, changes in bowel/urinary habits. On exam there are no overt masses but some fragile tissue and telangiectasia at the apex of her vaginal cuff, which is noticeably shallow. Most likely trauma or late radiation effects but recurrence/new malignancy needs to ruled out.  Plan:  - Transvaginal ultrasound - Referral to [place] can continue with intercourse as desired but monitor for worse bleeding or new symptoms

## 2016-03-31 NOTE — Patient Instructions (Signed)
Thank you for your visit today.  We understand that you are having bleeding after sex and we did some a spot of irritated tissue when we examined you. It is hard to saw what this could be, whether it is simply due to trauma from sex, changes from radiation, or from a new small tumor. We will order some studies to find the cause.  We have scheduled you for a transvaginal ultrasound imaging test and provided a referral to see a gynecologist.  Please call or return to clinic if you notice weight loss, abdominal pain, worsened bleeding, or notice foul smelling vaginal discharge.

## 2016-04-04 ENCOUNTER — Ambulatory Visit (INDEPENDENT_AMBULATORY_CARE_PROVIDER_SITE_OTHER): Payer: Medicare HMO | Admitting: Physician Assistant

## 2016-04-04 DIAGNOSIS — M170 Bilateral primary osteoarthritis of knee: Secondary | ICD-10-CM

## 2016-04-04 NOTE — Progress Notes (Signed)
Internal Medicine Clinic Attending  I saw and evaluated the patient.  I personally confirmed the key portions of the history and exam documented by Dr. Johnson and I reviewed pertinent patient test results.  The assessment, diagnosis, and plan were formulated together and I agree with the documentation in the resident's note.  

## 2016-04-27 ENCOUNTER — Other Ambulatory Visit: Payer: Self-pay | Admitting: *Deleted

## 2016-04-27 DIAGNOSIS — N930 Postcoital and contact bleeding: Secondary | ICD-10-CM

## 2016-04-27 NOTE — Progress Notes (Signed)
Radiology Dept requests pelvic ultrasound orders to be place in addition to all transvaginal orders.  I have placed an order for a complete (vs limited pelvic) ultrasound.  Please cosign order if appropriate.Despina Hidden Cassady10/25/201712:04 PM

## 2016-05-13 ENCOUNTER — Ambulatory Visit (HOSPITAL_COMMUNITY): Payer: Medicare HMO

## 2016-05-20 ENCOUNTER — Ambulatory Visit (HOSPITAL_COMMUNITY)
Admission: RE | Admit: 2016-05-20 | Discharge: 2016-05-20 | Disposition: A | Discharge status: Home / Self Care | Payer: Medicare HMO | Source: Ambulatory Visit | Attending: Internal Medicine | Admitting: Internal Medicine

## 2016-05-20 DIAGNOSIS — Z90710 Acquired absence of both cervix and uterus: Secondary | ICD-10-CM | POA: Diagnosis not present

## 2016-05-20 DIAGNOSIS — N939 Abnormal uterine and vaginal bleeding, unspecified: Secondary | ICD-10-CM | POA: Diagnosis not present

## 2016-05-20 DIAGNOSIS — N930 Postcoital and contact bleeding: Secondary | ICD-10-CM | POA: Diagnosis not present

## 2016-07-18 ENCOUNTER — Ambulatory Visit (INDEPENDENT_AMBULATORY_CARE_PROVIDER_SITE_OTHER): Payer: Medicare HMO | Admitting: Physician Assistant

## 2016-07-26 ENCOUNTER — Ambulatory Visit (INDEPENDENT_AMBULATORY_CARE_PROVIDER_SITE_OTHER): Payer: Medicare HMO | Admitting: Orthopaedic Surgery

## 2016-07-26 ENCOUNTER — Other Ambulatory Visit: Payer: Self-pay | Admitting: Internal Medicine

## 2016-07-26 DIAGNOSIS — M1711 Unilateral primary osteoarthritis, right knee: Secondary | ICD-10-CM | POA: Insufficient documentation

## 2016-07-26 DIAGNOSIS — M25562 Pain in left knee: Secondary | ICD-10-CM

## 2016-07-26 DIAGNOSIS — G8929 Other chronic pain: Secondary | ICD-10-CM

## 2016-07-26 DIAGNOSIS — I10 Essential (primary) hypertension: Secondary | ICD-10-CM

## 2016-07-26 DIAGNOSIS — M1712 Unilateral primary osteoarthritis, left knee: Secondary | ICD-10-CM | POA: Diagnosis not present

## 2016-07-26 DIAGNOSIS — M25561 Pain in right knee: Secondary | ICD-10-CM | POA: Diagnosis not present

## 2016-07-26 NOTE — Progress Notes (Signed)
Office Visit Note   Patient: Sherri Delgado           Date of Birth: 27-Jun-1946           MRN: [id number] Visit Date: 07/26/2016              Requested by: Sherri Herrlich, MD Sherri Delgado, Sherri Delgado [id number] PCP: Sherri Oka, MD   Assessment & Plan: Visit Diagnoses:  1. Chronic pain of left knee   2. Unilateral primary osteoarthritis, left knee   3. Chronic pain of right knee   4. Unilateral primary osteoarthritis, right knee     Plan: At this point she is tried and failed all forms conservative treatment. Her x-rays show severe end-stage arthritis of both her knees. Her left knee is much worse although both knees are severely deformed. At this point she does wish proceed with knee replacement surgery and I agree with this. Her pain is daily and it's 10 out of 10. His detrimentally affected her activities daily living, her her quality of life, and her mobility. I showed her knee model and explained in detail with knee replacement surgery involves. With thorough discussion of the risk and benefits of surgery and all questions were encouraged and answered. We will work on setting the surgery up and we would see her back in 2 weeks for continued evaluation of her left knee. Sometime then later we can pursue her right knee. We do not need x-rays at her first postoperative follow-up.  Follow-Up Instructions: Return for 2 weeks post-op.   Orders:  No orders of the defined types were placed in this encounter.  No orders of the defined types were placed in this encounter.     Procedures: No procedures performed   Clinical Data: No additional findings.   Subjective: Chief Complaint  Patient presents with  . Left Knee - Pain    Wants discuss bilateral knee replacements today. States pain is getting much worse, and she is ready.  . Right Knee - Pain    HPI The patient is well-known to me. She has chronic bilateral knee pain with known severe osteoporosis and degenerative  joint disease of both knees. Her pain is gotten to be so severe and she has failed all forms conservative treatment. She like to be considered for knee replacement surgery at this standpoint. Review of Systems   Objective: Vital Signs: LMP 02/23/1997   Physical Exam She is alert and oriented 3 in no acute distress. Ortho Exam She has significant bowleg deformity with significant varus of both knees. Both knees have severe patellofemoral crepitation and significant medial joint line tenderness as well. The both have good range of motion. She's neurovascularly intact. She walks with a significant limp and a waddling type of gait due to the very extremity both her knees and the bowleggedness. Specialty Comments:  No specialty comments available.  Imaging: No results found. X-rays were again reviewed from August of this past year and show severe end-stage arthritis of both knees. There is complete loss of medial joint space of both knees are severe sclerotic changes in particular ossifies throughout all 3 compartments of both knees. Her left knee actually shows calcifications in the soft tissues of the posterior aspect of the joint significant for synovial chondromatosis.  PMFS History: Patient Active Problem List   Diagnosis Date Noted  . Unilateral primary osteoarthritis, right knee 07/26/2016  . Unilateral primary osteoarthritis, left knee 07/26/2016  . PCB (post coital bleeding)  03/31/2016  . Breast pain, right 05/19/2015  . Routine general medical examination at a health care facility 11/04/2014  . OBESITY 07/16/2008  . KNEE PAIN 07/16/2008  . HYPERGLYCEMIA, BORDERLINE 06/24/2006  . CERVICAL CANCER 05/10/2006  . Hyperlipidemia 05/10/2006  . HTN (hypertension) 05/10/2006  . Osteopenia 05/10/2006   Past Medical History:  Diagnosis Date  . Exposure to TB    completed 9 months of INH and B6  . GERD (gastroesophageal reflux disease)   . History of cervical cancer    s/p  hysterectomyy. Most recent pap in 2009 normal  . Hyperlipidemia   . Hypertension   . Obesity   . Osteopenia    DEXA 08/2003, T score lumbar -1.9    Family History  Problem Relation Age of Onset  . Cancer Mother     died from brain tumor  . Cancer Father     died from liver cancer and cirrhosis  . Diabetes Brother   . Aneurysm Sister     died from brain aneurysm bleed  . Colon cancer Neg Hx   . Stomach cancer Neg Hx   . Rectal cancer Neg Hx   . Colon polyps Neg Hx     Past Surgical History:  Procedure Laterality Date  . ABDOMINAL HYSTERECTOMY  1998   In [place] "baby maker gone, playpen still there"  . COLONOSCOPY  2013   normal   . EYE SURGERY  2005   cataract surgery   Social History   Occupational History  . Not on file.   Social History Main Topics  . Smoking status: Never Smoker  . Smokeless tobacco: Never Used  . Alcohol use No  . Drug use: No  . Sexual activity: Not on file

## 2016-07-26 NOTE — Telephone Encounter (Signed)
Patient requesting a 90 day supply   amLODipine (NORVASC) 10 MG tablet  hydrochlorothiazide (HYDRODIURIL) 25 MG tablet lisinopril (PRINIVIL,ZESTRIL) 40 MG tablet  Patient needs to start getting her Medicines @ [facility].  Patient requesting to remove the her medications from [facility].   Please call patient to verify the correct Pharmacy.

## 2016-07-28 MED ORDER — HYDROCHLOROTHIAZIDE 25 MG PO TABS: 25.0000 mg | ORAL_TABLET | Freq: Every day | ORAL | 3 refills | Status: DC

## 2016-07-28 MED ORDER — AMLODIPINE BESYLATE 10 MG PO TABS: 10.0000 mg | ORAL_TABLET | Freq: Every day | ORAL | 3 refills | Status: DC

## 2016-07-28 MED ORDER — LISINOPRIL 40 MG PO TABS: 40.0000 mg | ORAL_TABLET | Freq: Every day | ORAL | 3 refills | Status: DC

## 2016-08-11 ENCOUNTER — Other Ambulatory Visit (INDEPENDENT_AMBULATORY_CARE_PROVIDER_SITE_OTHER): Payer: Self-pay | Admitting: Physician Assistant

## 2016-08-15 NOTE — Patient Instructions (Signed)
Sherri Delgado  08/15/2016   Your procedure is scheduled on: 08/26/16  Report to [facility] Main  Entrance take [facility]  elevators to 3rd floor to  [place] at    [phone].  Call this number if you have problems the morning of surgery [phone]   Remember: ONLY 1 PERSON MAY GO WITH YOU TO SHORT STAY TO GET  READY MORNING OF Sherri Delgado.  Do not eat food or drink liquids :After Midnight.     Take these medicines the morning of surgery with A SIP OF WATER: Norvasc                               You may not have any metal on your body including hair pins and              piercings  Do not wear jewelry, make-up, lotions, powders or perfumes, deodorant             Do not wear nail polish.  Do not shave  48 hours prior to surgery.               Do not bring valuables to the hospital. [place].  Contacts, dentures or bridgework may not be worn into surgery.  Leave suitcase in the car. After surgery it may be brought to your room.                Please read over the following fact sheets you were given: _____________________________________________________________________              WHAT IS A BLOOD TRANSFUSION? Blood Transfusion Information  A transfusion is the replacement of blood or some of its parts. Blood is made up of multiple cells which provide different functions.  Red blood cells carry oxygen and are used for blood loss replacement.  White blood cells fight against infection.  Platelets control bleeding.  Plasma helps clot blood.  Other blood products are available for specialized needs, such as hemophilia or other clotting disorders. BEFORE THE TRANSFUSION  Who gives blood for transfusions?   Healthy volunteers who are fully evaluated to make sure their blood is safe. This is blood bank blood. Transfusion therapy is the safest it has ever been in the practice of medicine. Before blood  is taken from a donor, a complete history is taken to make sure that person has no history of diseases nor engages in risky social behavior (examples are intravenous drug use or sexual activity with multiple partners). The donor's travel history is screened to minimize risk of transmitting infections, such as malaria. The donated blood is tested for signs of infectious diseases, such as HIV and hepatitis. The blood is then tested to be sure it is compatible with you in order to minimize the chance of a transfusion reaction. If you or a relative donates blood, this is often done in anticipation of surgery and is not appropriate for emergency situations. It takes many days to process the donated blood. RISKS AND COMPLICATIONS Although transfusion therapy is very safe and saves many lives, the main dangers of transfusion include:   Getting an infectious disease.  Developing a transfusion reaction. This is an allergic reaction to something in the blood you  were given. Every precaution is taken to prevent this. The decision to have a blood transfusion has been considered carefully by your caregiver before blood is given. Blood is not given unless the benefits outweigh the risks. AFTER THE TRANSFUSION  Right after receiving a blood transfusion, you will usually feel much better and more energetic. This is especially true if your red blood cells have gotten low (anemic). The transfusion raises the level of the red blood cells which carry oxygen, and this usually causes an energy increase.  The nurse administering the transfusion will monitor you carefully for complications. HOME CARE INSTRUCTIONS  No special instructions are needed after a transfusion. You may find your energy is better. Speak with your caregiver about any limitations on activity for underlying diseases you may have. SEEK MEDICAL CARE IF:   Your condition is not improving after your transfusion.  You develop redness or irritation at the  intravenous (IV) site. SEEK IMMEDIATE MEDICAL CARE IF:  Any of the following symptoms occur over the next 12 hours:  Shaking chills.  You have a temperature by mouth above 102 F (38.9 C), not controlled by medicine.  Chest, back, or muscle pain.  People around you feel you are not acting correctly or are confused.  Shortness of breath or difficulty breathing.  Dizziness and fainting.  You get a rash or develop hives.  You have a decrease in urine output.  Your urine turns a dark color or changes to pink, red, or brown. Any of the following symptoms occur over the next 10 days:  You have a temperature by mouth above 102 F (38.9 C), not controlled by medicine.  Shortness of breath.  Weakness after normal activity.  The white part of the eye turns yellow (jaundice).  You have a decrease in the amount of urine or are urinating less often.  Your urine turns a dark color or changes to pink, red, or brown. Document Released: 06/17/2000 Document Revised: 09/12/2011 Document Reviewed: 02/04/2008 Exit[employer] Patient Information 2014 [place].  _______________________________________________________________________[facility] - Preparing for Surgery Before surgery, you can play an important role.  Because skin is not sterile, your skin needs to be as free of germs as possible.  You can reduce the number of germs on your skin by washing with CHG (chlorahexidine gluconate) soap before surgery.  CHG is an antiseptic cleaner which kills germs and bonds with the skin to continue killing germs even after washing. Please DO NOT use if you have an allergy to CHG or antibacterial soaps.  If your skin becomes reddened/irritated stop using the CHG and inform your nurse when you arrive at Short Stay. Do not shave (including legs and underarms) for at least 48 hours prior to the first CHG shower.  You may shave your face/neck. Please follow these instructions carefully:  1.  Shower with CHG  Soap the night before surgery and the  morning of Surgery.  2.  If you choose to wash your hair, wash your hair first as usual with your  normal  shampoo.  3.  After you shampoo, rinse your hair and body thoroughly to remove the  shampoo.                           4.  Use CHG as you would any other liquid soap.  You can apply chg directly  to the skin and wash  Gently with a scrungie or clean washcloth.  5.  Apply the CHG Soap to your body ONLY FROM THE NECK DOWN.   Do not use on face/ open                           Wound or open sores. Avoid contact with eyes, ears mouth and genitals (private parts).                       Wash face,  Genitals (private parts) with your normal soap.             6.  Wash thoroughly, paying special attention to the area where your surgery  will be performed.  7.  Thoroughly rinse your body with warm water from the neck down.  8.  DO NOT shower/wash with your normal soap after using and rinsing off  the CHG Soap.                9.  Pat yourself dry with a clean towel.            10.  Wear clean pajamas.            11.  Place clean sheets on your bed the night of your first shower and do not  sleep with pets. Day of Surgery : Do not apply any lotions/deodorants the morning of surgery.  Please wear clean clothes to the hospital/surgery center.  FAILURE TO FOLLOW THESE INSTRUCTIONS MAY RESULT IN THE CANCELLATION OF YOUR SURGERY PATIENT SIGNATURE_________________________________  NURSE SIGNATURE__________________________________  ________________________________________________________________________   Adam Phenix  An incentive spirometer is a tool that can help keep your lungs clear and active. This tool measures how well you are filling your lungs with each breath. Taking long deep breaths may help reverse or decrease the chance of developing breathing (pulmonary) problems (especially infection) following:  A long period of time  when you are unable to move or be active. BEFORE THE PROCEDURE   If the spirometer includes an indicator to show your best effort, your nurse or respiratory therapist will set it to a desired goal.  If possible, sit up straight or lean slightly forward. Try not to slouch.  Hold the incentive spirometer in an upright position. INSTRUCTIONS FOR USE  1. Sit on the edge of your bed if possible, or sit up as far as you can in bed or on a chair. 2. Hold the incentive spirometer in an upright position. 3. Breathe out normally. 4. Place the mouthpiece in your mouth and seal your lips tightly around it. 5. Breathe in slowly and as deeply as possible, raising the piston or the ball toward the top of the column. 6. Hold your breath for 3-5 seconds or for as long as possible. Allow the piston or ball to fall to the bottom of the column. 7. Remove the mouthpiece from your mouth and breathe out normally. 8. Rest for a few seconds and repeat Steps 1 through 7 at least 10 times every 1-2 hours when you are awake. Take your time and take a few normal breaths between deep breaths. 9. The spirometer may include an indicator to show your best effort. Use the indicator as a goal to work toward during each repetition. 10. After each set of 10 deep breaths, practice coughing to be sure your lungs are clear. If you have an incision (the cut made at the time of surgery),  support your incision when coughing by placing a pillow or rolled up towels firmly against it. Once you are able to get out of bed, walk around indoors and cough well. You may stop using the incentive spirometer when instructed by your caregiver.  RISKS AND COMPLICATIONS  Take your time so you do not get dizzy or light-headed.  If you are in pain, you may need to take or ask for pain medication before doing incentive spirometry. It is harder to take a deep breath if you are having pain. AFTER USE  Rest and breathe slowly and easily.  It can be  helpful to keep track of a log of your progress. Your caregiver can provide you with a simple table to help with this. If you are using the spirometer at home, follow these instructions: [place] IF:   You are having difficultly using the spirometer.  You have trouble using the spirometer as often as instructed.  Your pain medication is not giving enough relief while using the spirometer.  You develop fever of 100.5 F (38.1 C) or higher. SEEK IMMEDIATE MEDICAL CARE IF:   You cough up bloody sputum that had not been present before.  You develop fever of 102 F (38.9 C) or greater.  You develop worsening pain at or near the incision site. MAKE SURE YOU:   Understand these instructions.  Will watch your condition.  Will get help right away if you are not doing well or get worse. Document Released: 10/31/2006 Document Revised: 09/12/2011 Document Reviewed: 01/01/2007 [facility] Patient Information 2014 [place], [place].   ________________________________________________________________________

## 2016-08-16 ENCOUNTER — Encounter (HOSPITAL_COMMUNITY)
Admission: RE | Admit: 2016-08-16 | Discharge: 2016-08-16 | Disposition: A | Discharge status: Home / Self Care | Payer: Medicare HMO | Source: Ambulatory Visit | Attending: Orthopaedic Surgery | Admitting: Orthopaedic Surgery

## 2016-08-16 ENCOUNTER — Encounter (HOSPITAL_COMMUNITY): Payer: Self-pay

## 2016-08-16 DIAGNOSIS — R001 Bradycardia, unspecified: Secondary | ICD-10-CM | POA: Diagnosis not present

## 2016-08-16 DIAGNOSIS — R7989 Other specified abnormal findings of blood chemistry: Secondary | ICD-10-CM | POA: Insufficient documentation

## 2016-08-16 DIAGNOSIS — F329 Major depressive disorder, single episode, unspecified: Secondary | ICD-10-CM | POA: Insufficient documentation

## 2016-08-16 DIAGNOSIS — E785 Hyperlipidemia, unspecified: Secondary | ICD-10-CM | POA: Insufficient documentation

## 2016-08-16 DIAGNOSIS — Z01812 Encounter for preprocedural laboratory examination: Secondary | ICD-10-CM | POA: Diagnosis not present

## 2016-08-16 DIAGNOSIS — R9431 Abnormal electrocardiogram [ECG] [EKG]: Secondary | ICD-10-CM | POA: Diagnosis not present

## 2016-08-16 DIAGNOSIS — Z01810 Encounter for preprocedural cardiovascular examination: Secondary | ICD-10-CM | POA: Insufficient documentation

## 2016-08-16 DIAGNOSIS — E669 Obesity, unspecified: Secondary | ICD-10-CM | POA: Diagnosis not present

## 2016-08-16 DIAGNOSIS — M1712 Unilateral primary osteoarthritis, left knee: Secondary | ICD-10-CM | POA: Insufficient documentation

## 2016-08-16 DIAGNOSIS — M8580 Other specified disorders of bone density and structure, unspecified site: Secondary | ICD-10-CM | POA: Diagnosis not present

## 2016-08-16 DIAGNOSIS — I10 Essential (primary) hypertension: Secondary | ICD-10-CM | POA: Insufficient documentation

## 2016-08-16 DIAGNOSIS — R69 Illness, unspecified: Secondary | ICD-10-CM | POA: Diagnosis not present

## 2016-08-16 HISTORY — DX: Major depressive disorder, single episode, unspecified: F32.9

## 2016-08-16 HISTORY — DX: Unspecified osteoarthritis, unspecified site: M19.90

## 2016-08-16 HISTORY — DX: Depression, unspecified: F32.A

## 2016-08-16 LAB — CBC
HCT: 35.5 % — ABNORMAL LOW (ref 36.0–46.0)
Hemoglobin: 12.7 g/dL (ref 12.0–15.0)
MCH: 29.2 pg (ref 26.0–34.0)
MCHC: 35.8 g/dL (ref 30.0–36.0)
MCV: 81.6 fL (ref 78.0–100.0)
PLATELETS: 228 10*3/uL (ref 150–400)
RBC: 4.35 MIL/uL (ref 3.87–5.11)
RDW: 13.6 % (ref 11.5–15.5)
WBC: 6.2 10*3/uL (ref 4.0–10.5)

## 2016-08-16 LAB — SURGICAL PCR SCREEN
MRSA, PCR: NEGATIVE
Staphylococcus aureus: NEGATIVE

## 2016-08-16 LAB — BASIC METABOLIC PANEL
Anion gap: 7 (ref 5–15)
BUN: 20 mg/dL (ref 6–20)
CALCIUM: 9.7 mg/dL (ref 8.9–10.3)
CO2: 28 mmol/L (ref 22–32)
CREATININE: 0.86 mg/dL (ref 0.44–1.00)
Chloride: 104 mmol/L (ref 101–111)
GFR calc non Af Amer: >60 mL/min (ref >60)
Glucose, Bld: 103 mg/dL — ABNORMAL HIGH (ref 65–99)
Potassium: 3.5 mmol/L (ref 3.5–5.1)
SODIUM: 139 mmol/L (ref 135–145)

## 2016-08-16 NOTE — Progress Notes (Signed)
Ov note 03/31/16 epic

## 2016-08-17 NOTE — Progress Notes (Signed)
Final EKG done 08/16/16 in epic

## 2016-08-25 NOTE — Anesthesia Preprocedure Evaluation (Addendum)
Anesthesia Evaluation  Patient identified by MRN, date of birth, ID band Patient awake    Reviewed: Allergy & Precautions, H&P , NPO status , Patient's Chart, lab work & pertinent test results  Airway Mallampati: II  TM Distance: >3 FB Neck ROM: Full    Dental no notable dental hx. (+) Edentulous Upper, Edentulous Lower, Dental Advisory Given   Pulmonary neg pulmonary ROS,    Pulmonary exam normal breath sounds clear to auscultation       Cardiovascular Exercise Tolerance: Good hypertension, Pt. on medications  Rhythm:Regular Rate:Normal     Neuro/Psych Depression negative neurological ROS     GI/Hepatic negative GI ROS, Neg liver ROS,   Endo/Other  negative endocrine ROS  Renal/GU negative Renal ROS  negative genitourinary   Musculoskeletal  (+) Arthritis , Osteoarthritis,    Abdominal   Peds  Hematology negative hematology ROS (+)   Anesthesia Other Findings   Reproductive/Obstetrics negative OB ROS                            Anesthesia Physical Anesthesia Plan  ASA: II  Anesthesia Plan: Spinal   Post-op Pain Management:  Regional for Post-op pain   Induction: Intravenous  Airway Management Planned: Simple Face Mask  Additional Equipment:   Intra-op Plan:   Post-operative Plan:   Informed Consent: I have reviewed the patients History and Physical, chart, labs and discussed the procedure including the risks, benefits and alternatives for the proposed anesthesia with the patient or authorized representative who has indicated his/her understanding and acceptance.   Dental advisory given  Plan Discussed with: CRNA  Anesthesia Plan Comments:         Anesthesia Quick Evaluation

## 2016-08-26 ENCOUNTER — Encounter (HOSPITAL_COMMUNITY): Payer: Self-pay | Admitting: *Deleted

## 2016-08-26 ENCOUNTER — Encounter (HOSPITAL_COMMUNITY)
Admission: AD | Disposition: A | Discharge status: Home Health | Payer: Self-pay | Source: Ambulatory Visit | Attending: Orthopaedic Surgery

## 2016-08-26 ENCOUNTER — Ambulatory Visit (HOSPITAL_COMMUNITY): Payer: Medicare HMO | Admitting: Anesthesiology

## 2016-08-26 ENCOUNTER — Inpatient Hospital Stay (HOSPITAL_COMMUNITY): Payer: Medicare HMO

## 2016-08-26 ENCOUNTER — Inpatient Hospital Stay (HOSPITAL_COMMUNITY)
Admission: AD | Admit: 2016-08-26 | Discharge: 2016-08-29 | DRG: 470 | Disposition: A | Discharge status: Home Health | Payer: Medicare HMO | Source: Ambulatory Visit | Attending: Orthopaedic Surgery | Admitting: Orthopaedic Surgery

## 2016-08-26 DIAGNOSIS — I10 Essential (primary) hypertension: Secondary | ICD-10-CM | POA: Diagnosis present

## 2016-08-26 DIAGNOSIS — Z90710 Acquired absence of both cervix and uterus: Secondary | ICD-10-CM

## 2016-08-26 DIAGNOSIS — M8580 Other specified disorders of bone density and structure, unspecified site: Secondary | ICD-10-CM | POA: Diagnosis present

## 2016-08-26 DIAGNOSIS — Z96652 Presence of left artificial knee joint: Secondary | ICD-10-CM

## 2016-08-26 DIAGNOSIS — D62 Acute posthemorrhagic anemia: Secondary | ICD-10-CM | POA: Diagnosis not present

## 2016-08-26 DIAGNOSIS — Z471 Aftercare following joint replacement surgery: Secondary | ICD-10-CM | POA: Diagnosis not present

## 2016-08-26 DIAGNOSIS — E669 Obesity, unspecified: Secondary | ICD-10-CM | POA: Diagnosis not present

## 2016-08-26 DIAGNOSIS — Z79899 Other long term (current) drug therapy: Secondary | ICD-10-CM

## 2016-08-26 DIAGNOSIS — M1712 Unilateral primary osteoarthritis, left knee: Principal | ICD-10-CM | POA: Diagnosis present

## 2016-08-26 DIAGNOSIS — E785 Hyperlipidemia, unspecified: Secondary | ICD-10-CM | POA: Diagnosis not present

## 2016-08-26 DIAGNOSIS — M659 Synovitis and tenosynovitis, unspecified: Secondary | ICD-10-CM | POA: Diagnosis not present

## 2016-08-26 DIAGNOSIS — Z6834 Body mass index (BMI) 34.0-34.9, adult: Secondary | ICD-10-CM | POA: Diagnosis not present

## 2016-08-26 DIAGNOSIS — Z8541 Personal history of malignant neoplasm of cervix uteri: Secondary | ICD-10-CM | POA: Diagnosis not present

## 2016-08-26 DIAGNOSIS — Z9849 Cataract extraction status, unspecified eye: Secondary | ICD-10-CM

## 2016-08-26 DIAGNOSIS — G8918 Other acute postprocedural pain: Secondary | ICD-10-CM | POA: Diagnosis not present

## 2016-08-26 DIAGNOSIS — R269 Unspecified abnormalities of gait and mobility: Secondary | ICD-10-CM | POA: Diagnosis not present

## 2016-08-26 HISTORY — DX: Presence of left artificial knee joint: Z96.652

## 2016-08-26 HISTORY — PX: TOTAL KNEE ARTHROPLASTY: SHX125

## 2016-08-26 SURGERY — ARTHROPLASTY, KNEE, TOTAL
Anesthesia: Spinal | Site: Knee | Laterality: Left

## 2016-08-26 MED ORDER — CELECOXIB 200 MG PO CAPS
200.0000 mg | ORAL_CAPSULE | Freq: Two times a day (BID) | ORAL | Status: DC
  Administered 2016-08-26 – 2016-08-29 (×7): 200 mg via ORAL
  Filled 2016-08-26 (×7): qty 1

## 2016-08-26 MED ORDER — GLYCOPYRROLATE 0.2 MG/ML IJ SOLN
INTRAMUSCULAR | Status: DC | PRN
  Administered 2016-08-26: 0.2 mg via INTRAVENOUS

## 2016-08-26 MED ORDER — AMLODIPINE BESYLATE 10 MG PO TABS
10.0000 mg | ORAL_TABLET | Freq: Every day | ORAL | Status: DC
  Administered 2016-08-27 – 2016-08-29 (×3): 10 mg via ORAL
  Filled 2016-08-26 (×3): qty 1

## 2016-08-26 MED ORDER — MIDAZOLAM HCL 2 MG/2ML IJ SOLN
INTRAMUSCULAR | Status: AC
  Filled 2016-08-26: qty 2

## 2016-08-26 MED ORDER — CELECOXIB 200 MG PO CAPS
ORAL_CAPSULE | ORAL | Status: AC
  Filled 2016-08-26: qty 1

## 2016-08-26 MED ORDER — CELECOXIB 200 MG PO CAPS
200.0000 mg | ORAL_CAPSULE | Freq: Once | ORAL | Status: AC
  Administered 2016-08-26: 200 mg via ORAL
  Filled 2016-08-26: qty 1

## 2016-08-26 MED ORDER — ZOLPIDEM TARTRATE 5 MG PO TABS: 5.0000 mg | ORAL_TABLET | Freq: Every evening | ORAL | Status: DC | PRN

## 2016-08-26 MED ORDER — OXYCODONE HCL 5 MG PO TABS
5.0000 mg | ORAL_TABLET | ORAL | Status: DC | PRN
  Administered 2016-08-27 – 2016-08-29 (×4): 5 mg via ORAL
  Filled 2016-08-26: qty 2
  Filled 2016-08-26: qty 1
  Filled 2016-08-26 (×2): qty 2
  Filled 2016-08-26 (×2): qty 1

## 2016-08-26 MED ORDER — MENTHOL 3 MG MT LOZG: 1.0000 | LOZENGE | OROMUCOSAL | Status: DC | PRN

## 2016-08-26 MED ORDER — ACETAMINOPHEN 10 MG/ML IV SOLN
INTRAVENOUS | Status: DC | PRN
  Administered 2016-08-26: 1000 mg via INTRAVENOUS

## 2016-08-26 MED ORDER — LACTATED RINGERS IV SOLN
INTRAVENOUS | Status: DC | PRN
  Administered 2016-08-26 (×3): via INTRAVENOUS

## 2016-08-26 MED ORDER — DOCUSATE SODIUM 100 MG PO CAPS
100.0000 mg | ORAL_CAPSULE | Freq: Two times a day (BID) | ORAL | Status: DC
  Administered 2016-08-26 – 2016-08-29 (×6): 100 mg via ORAL
  Filled 2016-08-26 (×6): qty 1

## 2016-08-26 MED ORDER — BUPIVACAINE IN DEXTROSE 0.75-8.25 % IT SOLN
INTRATHECAL | Status: DC | PRN
  Administered 2016-08-26: 15 mg via INTRATHECAL

## 2016-08-26 MED ORDER — PHENOL 1.4 % MT LIQD
1.0000 | OROMUCOSAL | Status: DC | PRN
Start: 2016-08-26 — End: 2016-08-29

## 2016-08-26 MED ORDER — POLYETHYLENE GLYCOL 3350 17 G PO PACK: 17.0000 g | PACK | Freq: Every day | ORAL | Status: DC | PRN

## 2016-08-26 MED ORDER — SODIUM CHLORIDE 0.9 % IV SOLN
INTRAVENOUS | Status: DC
  Administered 2016-08-26: 12:00:00 via INTRAVENOUS

## 2016-08-26 MED ORDER — GABAPENTIN 300 MG PO CAPS
ORAL_CAPSULE | ORAL | Status: AC
  Filled 2016-08-26: qty 1

## 2016-08-26 MED ORDER — ONDANSETRON HCL 4 MG/2ML IJ SOLN
4.0000 mg | Freq: Four times a day (QID) | INTRAMUSCULAR | Status: DC | PRN
  Administered 2016-08-27 – 2016-08-29 (×2): 4 mg via INTRAVENOUS
  Filled 2016-08-26 (×2): qty 2

## 2016-08-26 MED ORDER — CHLORHEXIDINE GLUCONATE 4 % EX LIQD: 60.0000 mL | Freq: Once | CUTANEOUS | Status: DC

## 2016-08-26 MED ORDER — ONDANSETRON HCL 4 MG/2ML IJ SOLN
INTRAMUSCULAR | Status: DC | PRN
  Administered 2016-08-26: 4 mg via INTRAVENOUS

## 2016-08-26 MED ORDER — HYDROCHLOROTHIAZIDE 25 MG PO TABS
25.0000 mg | ORAL_TABLET | Freq: Every day | ORAL | Status: DC
  Administered 2016-08-28 – 2016-08-29 (×2): 25 mg via ORAL
  Filled 2016-08-26 (×3): qty 1

## 2016-08-26 MED ORDER — MIDAZOLAM HCL 5 MG/5ML IJ SOLN
INTRAMUSCULAR | Status: DC | PRN
  Administered 2016-08-26: 2 mg via INTRAVENOUS

## 2016-08-26 MED ORDER — DEXAMETHASONE SODIUM PHOSPHATE 10 MG/ML IJ SOLN
INTRAMUSCULAR | Status: DC | PRN
  Administered 2016-08-26: 10 mg via INTRAVENOUS

## 2016-08-26 MED ORDER — CEFAZOLIN SODIUM-DEXTROSE 2-4 GM/100ML-% IV SOLN
INTRAVENOUS | Status: AC
  Filled 2016-08-26: qty 100

## 2016-08-26 MED ORDER — TRANEXAMIC ACID 1000 MG/10ML IV SOLN
1000.0000 mg | INTRAVENOUS | Status: AC
  Administered 2016-08-26: 1000 mg via INTRAVENOUS
  Filled 2016-08-26: qty 1100

## 2016-08-26 MED ORDER — DEXAMETHASONE SODIUM PHOSPHATE 10 MG/ML IJ SOLN
INTRAMUSCULAR | Status: AC
  Filled 2016-08-26: qty 1

## 2016-08-26 MED ORDER — FENTANYL CITRATE (PF) 100 MCG/2ML IJ SOLN
INTRAMUSCULAR | Status: DC | PRN
  Administered 2016-08-26: 100 ug via INTRAVENOUS

## 2016-08-26 MED ORDER — ACETAMINOPHEN 10 MG/ML IV SOLN
INTRAVENOUS | Status: AC
  Filled 2016-08-26: qty 100

## 2016-08-26 MED ORDER — METOCLOPRAMIDE HCL 5 MG PO TABS: 5.0000 mg | ORAL_TABLET | Freq: Three times a day (TID) | ORAL | Status: DC | PRN

## 2016-08-26 MED ORDER — ONDANSETRON HCL 4 MG/2ML IJ SOLN
INTRAMUSCULAR | Status: AC
  Filled 2016-08-26: qty 2

## 2016-08-26 MED ORDER — GABAPENTIN 300 MG PO CAPS
300.0000 mg | ORAL_CAPSULE | Freq: Once | ORAL | Status: AC
  Administered 2016-08-26: 300 mg via ORAL

## 2016-08-26 MED ORDER — DIPHENHYDRAMINE HCL 12.5 MG/5ML PO ELIX: 12.5000 mg | ORAL_SOLUTION | ORAL | Status: DC | PRN

## 2016-08-26 MED ORDER — CEFAZOLIN IN D5W 1 GM/50ML IV SOLN
1.0000 g | Freq: Four times a day (QID) | INTRAVENOUS | Status: AC
  Administered 2016-08-26 (×2): 1 g via INTRAVENOUS
  Filled 2016-08-26 (×3): qty 50

## 2016-08-26 MED ORDER — ROPIVACAINE HCL 7.5 MG/ML IJ SOLN
INTRAMUSCULAR | Status: DC | PRN
  Administered 2016-08-26: 20 mL via PERINEURAL

## 2016-08-26 MED ORDER — METHOCARBAMOL 1000 MG/10ML IJ SOLN
500.0000 mg | Freq: Four times a day (QID) | INTRAVENOUS | Status: DC | PRN
  Filled 2016-08-26: qty 5

## 2016-08-26 MED ORDER — ACETAMINOPHEN 325 MG PO TABS
650.0000 mg | ORAL_TABLET | Freq: Four times a day (QID) | ORAL | Status: DC | PRN
  Administered 2016-08-27: 650 mg via ORAL
  Filled 2016-08-26 (×2): qty 2

## 2016-08-26 MED ORDER — HYDROMORPHONE HCL 1 MG/ML IJ SOLN
1.0000 mg | INTRAMUSCULAR | Status: DC | PRN
  Administered 2016-08-27: 1 mg via INTRAVENOUS
  Filled 2016-08-26: qty 1

## 2016-08-26 MED ORDER — PROPOFOL 500 MG/50ML IV EMUL
INTRAVENOUS | Status: DC | PRN
  Administered 2016-08-26: 100 ug/kg/min via INTRAVENOUS

## 2016-08-26 MED ORDER — CEFAZOLIN SODIUM-DEXTROSE 2-4 GM/100ML-% IV SOLN
2.0000 g | INTRAVENOUS | Status: AC
  Administered 2016-08-26: 2 g via INTRAVENOUS

## 2016-08-26 MED ORDER — RIVAROXABAN 10 MG PO TABS
10.0000 mg | ORAL_TABLET | Freq: Every day | ORAL | Status: DC
  Administered 2016-08-27 – 2016-08-29 (×3): 10 mg via ORAL
  Filled 2016-08-26 (×3): qty 1

## 2016-08-26 MED ORDER — METHOCARBAMOL 500 MG PO TABS
500.0000 mg | ORAL_TABLET | Freq: Four times a day (QID) | ORAL | Status: DC | PRN
  Administered 2016-08-27 – 2016-08-29 (×4): 500 mg via ORAL
  Filled 2016-08-26 (×4): qty 1

## 2016-08-26 MED ORDER — PROPOFOL 10 MG/ML IV BOLUS
INTRAVENOUS | Status: AC
  Filled 2016-08-26: qty 20

## 2016-08-26 MED ORDER — SODIUM CHLORIDE 0.9 % IR SOLN
Status: DC | PRN
  Administered 2016-08-26: 3000 mL

## 2016-08-26 MED ORDER — PHENYLEPHRINE HCL 10 MG/ML IJ SOLN
INTRAVENOUS | Status: DC | PRN
  Administered 2016-08-26: 25 ug/min via INTRAVENOUS

## 2016-08-26 MED ORDER — ALUM & MAG HYDROXIDE-SIMETH 200-200-20 MG/5ML PO SUSP: 30.0000 mL | ORAL | Status: DC | PRN

## 2016-08-26 MED ORDER — FENTANYL CITRATE (PF) 100 MCG/2ML IJ SOLN
INTRAMUSCULAR | Status: AC
  Filled 2016-08-26: qty 2

## 2016-08-26 MED ORDER — ONDANSETRON HCL 4 MG PO TABS: 4.0000 mg | ORAL_TABLET | Freq: Four times a day (QID) | ORAL | Status: DC | PRN

## 2016-08-26 MED ORDER — ACETAMINOPHEN 650 MG RE SUPP: 650.0000 mg | Freq: Four times a day (QID) | RECTAL | Status: DC | PRN

## 2016-08-26 MED ORDER — METOCLOPRAMIDE HCL 5 MG/ML IJ SOLN: 5.0000 mg | Freq: Three times a day (TID) | INTRAMUSCULAR | Status: DC | PRN

## 2016-08-26 MED ORDER — HYDROMORPHONE HCL 1 MG/ML IJ SOLN: 0.2500 mg | INTRAMUSCULAR | Status: DC | PRN

## 2016-08-26 MED ORDER — PROPOFOL 10 MG/ML IV BOLUS
INTRAVENOUS | Status: AC
  Filled 2016-08-26: qty 60

## 2016-08-26 MED ORDER — LISINOPRIL 20 MG PO TABS
40.0000 mg | ORAL_TABLET | Freq: Every day | ORAL | Status: DC
  Administered 2016-08-28 – 2016-08-29 (×2): 40 mg via ORAL
  Filled 2016-08-26 (×3): qty 2

## 2016-08-26 SURGICAL SUPPLY — 51 items
APL SKNCLS STERI-STRIP NONHPOA (GAUZE/BANDAGES/DRESSINGS) ×1
BAG SPEC THK2 15X12 ZIP CLS (MISCELLANEOUS)
BAG ZIPLOCK 12X15 (MISCELLANEOUS) IMPLANT
BANDAGE ACE 6X5 VEL STRL LF (GAUZE/BANDAGES/DRESSINGS) ×2 IMPLANT
BENZOIN TINCTURE PRP APPL 2/3 (GAUZE/BANDAGES/DRESSINGS) ×1 IMPLANT
BLADE SAG 18X100X1.27 (BLADE) IMPLANT
BOWL SMART MIX CTS (DISPOSABLE) ×2 IMPLANT
CAPT KNEE TOTAL 3 ×1 IMPLANT
CEMENT BONE SIMPLEX SPEEDSET (Cement) ×4 IMPLANT
CLOTH BEACON ORANGE TIMEOUT ST (SAFETY) ×2 IMPLANT
CUFF TOURN SGL QUICK 34 (TOURNIQUET CUFF) ×2
CUFF TRNQT CYL 34X4X40X1 (TOURNIQUET CUFF) ×1 IMPLANT
DRAPE U-SHAPE 47X51 STRL (DRAPES) ×2 IMPLANT
DRSG AQUACEL AG ADV 3.5X10 (GAUZE/BANDAGES/DRESSINGS) ×2 IMPLANT
DRSG PAD ABDOMINAL 8X10 ST (GAUZE/BANDAGES/DRESSINGS) ×2 IMPLANT
DURAPREP 26ML APPLICATOR (WOUND CARE) ×2 IMPLANT
ELECT REM PT RETURN 9FT ADLT (ELECTROSURGICAL) ×2
ELECTRODE REM PT RTRN 9FT ADLT (ELECTROSURGICAL) ×1 IMPLANT
GAUZE SPONGE 4X4 12PLY STRL (GAUZE/BANDAGES/DRESSINGS) ×2 IMPLANT
GAUZE XEROFORM 1X8 LF (GAUZE/BANDAGES/DRESSINGS) IMPLANT
GLOVE BIO SURGEON STRL SZ7.5 (GLOVE) ×2 IMPLANT
GLOVE BIOGEL PI IND STRL 8 (GLOVE) ×2 IMPLANT
GLOVE BIOGEL PI INDICATOR 8 (GLOVE) ×2
GLOVE ECLIPSE 8.0 STRL XLNG CF (GLOVE) ×2 IMPLANT
GOWN STRL REUS W/TWL XL LVL3 (GOWN DISPOSABLE) ×4 IMPLANT
HANDPIECE INTERPULSE COAX TIP (DISPOSABLE) ×2
IMMOBILIZER KNEE 20 (SOFTGOODS) ×2
IMMOBILIZER KNEE 20 THIGH 36 (SOFTGOODS) ×1 IMPLANT
NS IRRIG 1000ML POUR BTL (IV SOLUTION) ×2 IMPLANT
PACK TOTAL KNEE CUSTOM (KITS) ×2 IMPLANT
PAD ABD 8X10 STRL (GAUZE/BANDAGES/DRESSINGS) ×1 IMPLANT
PADDING CAST COTTON 6X4 STRL (CAST SUPPLIES) ×3 IMPLANT
POSITIONER SURGICAL ARM (MISCELLANEOUS) ×2 IMPLANT
SET HNDPC FAN SPRY TIP SCT (DISPOSABLE) ×1 IMPLANT
SET PAD KNEE POSITIONER (MISCELLANEOUS) ×2 IMPLANT
STAPLER VISISTAT 35W (STAPLE) IMPLANT
STRIP CLOSURE SKIN 1/2X4 (GAUZE/BANDAGES/DRESSINGS) ×1 IMPLANT
SUCTION FRAZIER HANDLE 12FR (TUBING) ×1
SUCTION TUBE FRAZIER 12FR DISP (TUBING) ×1 IMPLANT
SUT MNCRL AB 4-0 PS2 18 (SUTURE) IMPLANT
SUT VIC AB 0 CT1 27 (SUTURE) ×2
SUT VIC AB 0 CT1 27XBRD ANTBC (SUTURE) ×1 IMPLANT
SUT VIC AB 1 CT1 27 (SUTURE) ×4
SUT VIC AB 1 CT1 27XBRD ANTBC (SUTURE) ×2 IMPLANT
SUT VIC AB 2-0 CT1 27 (SUTURE) ×4
SUT VIC AB 2-0 CT1 TAPERPNT 27 (SUTURE) ×2 IMPLANT
TRAY FOLEY CATH 14FRSI W/METER (CATHETERS) ×1 IMPLANT
TRAY FOLEY W/METER SILVER 16FR (SET/KITS/TRAYS/PACK) ×1 IMPLANT
WATER STERILE IRR 1500ML POUR (IV SOLUTION) ×3 IMPLANT
WRAP KNEE MAXI GEL POST OP (GAUZE/BANDAGES/DRESSINGS) ×2 IMPLANT
YANKAUER SUCT BULB TIP 10FT TU (MISCELLANEOUS) ×2 IMPLANT

## 2016-08-26 NOTE — Progress Notes (Signed)
Portable AP and Lateral Left Knee X-rays done. 

## 2016-08-26 NOTE — Brief Op Note (Signed)
08/26/2016  9:11 AM  PATIENT:  Oletha Cruel  71 y.o. female  PRE-OPERATIVE DIAGNOSIS:  osteoarthritis left knee  POST-OPERATIVE DIAGNOSIS:  osteoarthritis left knee  PROCEDURE:  Procedure(s): LEFT TOTAL KNEE ARTHROPLASTY (Left)  SURGEON:  Surgeon(s) and Role:    * Mcarthur Rossetti, MD - Primary  PHYSICIAN ASSISTANT: Benita Stabile, PA-C  ANESTHESIA:   regional and spinal  EBL:  Total I/O In: 2000 [I.V.:2000] Out: 225 [Urine:75; Blood:150]  COUNTS:  YES  TOURNIQUET:   Total Tourniquet Time Documented: Thigh (Left) - 47 minutes Total: Thigh (Left) - 47 minutes   DICTATION: .Other Dictation: Dictation Number [id number]  PLAN OF CARE: Admit to inpatient   PATIENT DISPOSITION:  PACU - hemodynamically stable.   Delay start of Pharmacological VTE agent (>24hrs) due to surgical blood loss or risk of bleeding: no

## 2016-08-26 NOTE — Progress Notes (Signed)
X-ray results noted-Dr. Ninfa Linden aware

## 2016-08-26 NOTE — Evaluation (Signed)
Physical Therapy Evaluation Patient Details Name: Sherri Delgado MRN: [id number] DOB: Feb 28, 1946 Today's Date: 08/26/2016   History of Present Illness  Pt s/p L TKR  Clinical Impression  Pt s/p L TKR and presents with decreased L LE strength/ROM and post op pain limiting functional mobility.  Pt should progress to dc home with intermittent assist of family/friends and HHPT follow up.    Follow Up Recommendations Home health PT    Equipment Recommendations  Rolling walker with 5" wheels    Recommendations for Other Services OT consult     Precautions / Restrictions Precautions Precautions: Fall;Knee Required Braces or Orthoses: Knee Immobilizer - Left Knee Immobilizer - Left: Discontinue once straight leg raise with < 10 degree lag Restrictions Weight Bearing Restrictions: No Other Position/Activity Restrictions: WBAT      Mobility  Bed Mobility Overal bed mobility: Needs Assistance Bed Mobility: Supine to Sit     Supine to sit: Min assist     General bed mobility comments: cues for sequence and use of R LE to self assist  Transfers Overall transfer level: Needs assistance Equipment used: Rolling walker (2 wheeled) Transfers: Sit to/from Stand Sit to Stand: Min assist         General transfer comment: cues for LE management and use of UEs to self assist  Ambulation/Gait Ambulation/Gait assistance: Min assist Ambulation Distance (Feet): 58 Feet Assistive device: Rolling walker (2 wheeled) Gait Pattern/deviations: Step-to pattern;Decreased step length - right;Decreased step length - left;Shuffle;Trunk flexed Gait velocity: decr Gait velocity interpretation: Below normal speed for age/gender General Gait Details: cues for sequence, posture and position from [employer]            Wheelchair Mobility    Modified Rankin (Stroke Patients Only)       Balance                                             Pertinent Vitals/Pain Pain  Assessment: 0-10 Pain Score: 4  Pain Location: L knee Pain Descriptors / Indicators: Aching;Sore Pain Intervention(s): Premedicated before session;Limited activity within patient's tolerance;Monitored during session;Ice applied    Home Living Family/patient expects to be discharged to:: Private residence Living Arrangements: Alone Available Help at Discharge: Available PRN/intermittently;Family;Friend(s) Type of Home: Apartment Home Access: Stairs to enter Entrance Stairs-Rails: Right;Left Entrance Stairs-Number of Steps: 12 Home Layout: One level Home Equipment: Cane - single point      Prior Function Level of Independence: Independent;Independent with assistive device(s)               Hand Dominance        Extremity/Trunk Assessment   Upper Extremity Assessment Upper Extremity Assessment: Overall WFL for tasks assessed    Lower Extremity Assessment Lower Extremity Assessment: LLE deficits/detail    Cervical / Trunk Assessment Cervical / Trunk Assessment: Normal  Communication   Communication: No difficulties  Cognition Arousal/Alertness: Awake/alert Behavior During Therapy: WFL for tasks assessed/performed Overall Cognitive Status: Within Functional Limits for tasks assessed                      General Comments      Exercises Total Joint Exercises Ankle Circles/Pumps: AROM;Both;15 reps;Supine   Assessment/Plan    PT Assessment Patient needs continued PT services  PT Problem List Decreased strength;Decreased range of motion;Decreased activity tolerance;Decreased mobility;Decreased knowledge of use of DME;Pain  PT Treatment Interventions DME instruction;Gait training;Stair training;Functional mobility training;Therapeutic activities;Therapeutic exercise;Patient/family education    PT Goals (Current goals can be found in the Care Plan section)  Acute Rehab PT Goals Patient Stated Goal: Regain IND PT Goal Formulation: With patient Time  For Goal Achievement: 08/29/16 Potential to Achieve Goals: Good    Frequency 7X/week   Barriers to discharge        Co-evaluation               End of Session Equipment Utilized During Treatment: Gait belt;Left knee immobilizer Activity Tolerance: Patient tolerated treatment well Patient left: in chair;with call bell/phone within reach;with chair alarm set Nurse Communication: Mobility status PT Visit Diagnosis: Difficulty in walking, not elsewhere classified (R26.2)         Time: [id number] PT Time Calculation (min) (ACUTE ONLY): 23 min   Charges:   PT Evaluation $PT Eval Low Complexity: 1 Procedure PT Treatments $Gait Training: 8-22 mins   PT G Codes:         Mitchel Delduca 08/26/2016, 5:05 PM

## 2016-08-26 NOTE — Anesthesia Procedure Notes (Signed)
Spinal  Patient location during procedure: OR Start time: 08/26/2016 7:43 AM End time: 08/26/2016 7:46 AM Staffing Anesthesiologist: Roderic Palau Performed: anesthesiologist  Preanesthetic Checklist Completed: patient identified, surgical consent, pre-op evaluation, timeout performed, IV checked, risks and benefits discussed and monitors and equipment checked Spinal Block Patient position: sitting Prep: DuraPrep Patient monitoring: cardiac monitor, continuous pulse ox and blood pressure Approach: midline Location: L3-4 Injection technique: single-shot Needle Needle type: Pencan  Needle gauge: 24 G Needle length: 9 cm Assessment Sensory level: T8 Additional Notes Functioning IV was confirmed and monitors were applied. Sterile prep and drape, including hand hygiene and sterile gloves were used. The patient was positioned and the spine was prepped. The skin was anesthetized with lidocaine.  Free flow of clear CSF was obtained prior to injecting local anesthetic into the CSF.  The spinal needle aspirated freely following injection.  The needle was carefully withdrawn.  The patient tolerated the procedure well. CRNA attempted twice.

## 2016-08-26 NOTE — Transfer of Care (Signed)
Immediate Anesthesia Transfer of Care Note  Patient: Sherri Delgado  Procedure(s) Performed: Procedure(s): LEFT TOTAL KNEE ARTHROPLASTY (Left)  Patient Location: PACU  Anesthesia Type:Spinal  Level of Consciousness: awake, alert , oriented and patient cooperative  Airway & Oxygen Therapy: Patient Spontanous Breathing and Patient connected to face mask oxygen  Post-op Assessment: Report given to RN and Post -op Vital signs reviewed and stable  Post vital signs: stable  Last Vitals:  Vitals:   08/26/16 0555  BP: 129/70  Pulse: 69  Resp: 16  Temp: 37.1 C    Last Pain:  Vitals:   08/26/16 0609  TempSrc:   PainSc: 4       Patients Stated Pain Goal: 4 ([id number])  Complications: No apparent anesthesia complications  [id number] spinal level

## 2016-08-26 NOTE — Anesthesia Postprocedure Evaluation (Signed)
Anesthesia Post Note  Patient: Sherri Delgado  Procedure(s) Performed: Procedure(s) (LRB): LEFT TOTAL KNEE ARTHROPLASTY (Left)  Patient location during evaluation: PACU Anesthesia Type: Spinal Level of consciousness: awake and alert Pain management: pain level controlled Vital Signs Assessment: post-procedure vital signs reviewed and stable Respiratory status: spontaneous breathing and respiratory function stable Cardiovascular status: blood pressure returned to baseline and stable Postop Assessment: spinal receding Anesthetic complications: no       Last Vitals:  Vitals:   08/26/16 1030 08/26/16 1045  BP: 103/64 110/69  Pulse: 61 63  Resp: 14 (!) 21  Temp: 36.7 C 36.7 C    Last Pain:  Vitals:   08/26/16 1030  TempSrc:   PainSc: 0-No pain                 Namine Beahm,W. EDMOND

## 2016-08-26 NOTE — H&P (Signed)
TOTAL KNEE ADMISSION H&P  Patient is being admitted for left total knee arthroplasty.  Subjective:  Chief Complaint:left knee pain.  HPI: Sherri Delgado, 71 y.o. female, has a history of pain and functional disability in the left knee due to arthritis and has failed non-surgical conservative treatments for greater than 12 weeks to includeNSAID's and/or analgesics, corticosteriod injections, viscosupplementation injections, flexibility and strengthening excercises, use of assistive devices, weight reduction as appropriate and activity modification.  Onset of symptoms was gradual, starting 3 years ago with gradually worsening course since that time. The patient noted no past surgery on the left knee(s).  Patient currently rates pain in the left knee(s) at 10 out of 10 with activity. Patient has night pain, worsening of pain with activity and weight bearing, pain that interferes with activities of daily living, pain with passive range of motion, crepitus and joint swelling.  Patient has evidence of subchondral sclerosis, periarticular osteophytes and joint space narrowing by imaging studies. There is no active infection.  Patient Active Problem List   Diagnosis Date Noted  . Unilateral primary osteoarthritis, right knee 07/26/2016  . Unilateral primary osteoarthritis, left knee 07/26/2016  . PCB (post coital bleeding) 03/31/2016  . Breast pain, right 05/19/2015  . Routine general medical examination at a health care facility 11/04/2014  . OBESITY 07/16/2008  . KNEE PAIN 07/16/2008  . HYPERGLYCEMIA, BORDERLINE 06/24/2006  . CERVICAL CANCER 05/10/2006  . Hyperlipidemia 05/10/2006  . HTN (hypertension) 05/10/2006  . Osteopenia 05/10/2006   Past Medical History:  Diagnosis Date  . Arthritis   . Depression    Hx of with divorce  . History of cervical cancer    s/p hysterectomyy. Most recent pap in 2009 normal  . Hyperlipidemia   . Hypertension   . Obesity   . Osteopenia    DEXA 08/2003, T  score lumbar -1.9    Past Surgical History:  Procedure Laterality Date  . ABDOMINAL HYSTERECTOMY  1998   In [place] "baby maker gone, playpen still there"  . COLONOSCOPY  2013   normal   . EYE SURGERY  2005   cataract surgery    Prescriptions Prior to Admission  Medication Sig Dispense Refill Last Dose  . amLODipine (NORVASC) 10 MG tablet Take 1 tablet (10 mg total) by mouth daily. 90 tablet 3 08/26/2016 at 0415  . diphenhydrAMINE (SOMINEX) 25 MG tablet Take 2 tablets (50 mg total) by mouth at bedtime. Patient uses this for sleep every night per patient (Patient taking differently: Take 75 mg by mouth at bedtime. ) 180 tablet 4 08/24/2016  . hydrochlorothiazide (HYDRODIURIL) 25 MG tablet Take 1 tablet (25 mg total) by mouth daily. 90 tablet 3 08/25/2016  . lisinopril (PRINIVIL,ZESTRIL) 40 MG tablet Take 1 tablet (40 mg total) by mouth daily. 90 tablet 3    Not on File  Social History  Substance Use Topics  . Smoking status: Never Smoker  . Smokeless tobacco: Never Used  . Alcohol use 0.6 oz/week    1 Glasses of wine per week     Comment: Occasional    Family History  Problem Relation Age of Onset  . Cancer Mother     died from brain tumor  . Cancer Father     died from liver cancer and cirrhosis  . Diabetes Brother   . Aneurysm Sister     died from brain aneurysm bleed  . Colon cancer Neg Hx   . Stomach cancer Neg Hx   . Rectal cancer Neg Hx   .  Colon polyps Neg Hx      Review of Systems  Musculoskeletal: Positive for joint pain.  All other systems reviewed and are negative.   Objective:  Physical Exam  Constitutional: She is oriented to person, place, and time. She appears well-developed and well-nourished.  HENT:  Head: Normocephalic and atraumatic.  Eyes: EOM are normal. Pupils are equal, round, and reactive to light.  Neck: Normal range of motion. Neck supple.  Cardiovascular: Normal rate and regular rhythm.   Respiratory: Effort normal and breath sounds normal.   GI: Soft. Bowel sounds are normal.  Musculoskeletal:       Left knee: She exhibits decreased range of motion, swelling, effusion and abnormal alignment. Tenderness found. Medial joint line and lateral joint line tenderness noted.  Neurological: She is alert and oriented to person, place, and time.  Skin: Skin is warm and dry.  Psychiatric: She has a normal mood and affect.    Vital signs in last 24 hours: Temp:  [98.7 F (37.1 C)] 98.7 F (37.1 C) (02/23 0555) Pulse Rate:  [69] 69 (02/23 0555) Resp:  [16] 16 (02/23 0555) BP: (129)/(70) 129/70 (02/23 0555) SpO2:  [100 %] 100 % (02/23 0555) Weight:  [203 lb (92.1 kg)] 203 lb (92.1 kg) (02/23 0609)  Labs:   Estimated body mass index is 34.84 kg/m as calculated from the following:   Height as of this encounter: 5\' 4"  (1.626 m).   Weight as of this encounter: 203 lb (92.1 kg).   Imaging Review Plain radiographs demonstrate severe degenerative joint disease of the left knee(s). The overall alignment ismild varus. The bone quality appears to be good for age and reported activity level.  Assessment/Plan:  End stage arthritis, left knee   The patient history, physical examination, clinical judgment of the provider and imaging studies are consistent with end stage degenerative joint disease of the left knee(s) and total knee arthroplasty is deemed medically necessary. The treatment options including medical management, injection therapy arthroscopy and arthroplasty were discussed at length. The risks and benefits of total knee arthroplasty were presented and reviewed. The risks due to aseptic loosening, infection, stiffness, patella tracking problems, thromboembolic complications and other imponderables were discussed. The patient acknowledged the explanation, agreed to proceed with the plan and consent was signed. Patient is being admitted for inpatient treatment for surgery, pain control, PT, OT, prophylactic antibiotics, VTE prophylaxis,  progressive ambulation and ADL's and discharge planning. The patient is planning to be discharged to skilled nursing facility

## 2016-08-26 NOTE — Op Note (Signed)
NAMETELIYAH, ALZATE NO.:  [id number]  MEDICAL RECORD NO.:  [id number]  LOCATION:  PERIO                        FACILITY:  [facility]  PHYSICIAN:  Lind Guest. Ninfa Linden, M.D.DATE OF BIRTH:  1946/03/19  DATE OF PROCEDURE:  08/26/2016 DATE OF DISCHARGE:                              OPERATIVE REPORT   PREOPERATIVE DIAGNOSIS:  Primary osteoarthritis and degenerative joint disease, right knee with also synovial chondromatosis.  POSTOPERATIVE DIAGNOSIS:  Primary osteoarthritis and degenerative joint disease, right knee with also synovial chondromatosis.  PROCEDURE:  Left total knee arthroplasty.  IMPLANTS:  Stryker Triathlon knee with 3 femur, size 3 universal tibial base plate, 11 mm polyethylene insert, size 29 patellar button.  SURGEON:  Lind Guest. Ninfa Linden, MD.  ASSISTANT:  Erskine Emery, PA-C.  ANESTHESIA: 1. Regional left lower extremity block. 2. Spinal.  ANTIBIOTICS:  2 g of IV Ancef.  TOURNIQUET TIME:  Under 1 hour.  BLOOD LOSS:  Less than 200 mL.  COMPLICATIONS:  None.  INDICATIONS:  Ms. Harke is a 71 year old female, well known to me. She has severe debilitating arthritis involving her left knee with also synovial chondromatosis with significant calcifications in the posterior joint capsule.  At this point, she has tried and failed all forms of conservative treatment, and she does wish to proceed with a total knee replacement.  She understands our goals are decreased pain, improved mobility, and overall improved quality of life.  Right now, her pain is 10/10.  It has detrimentally affected her activities of daily living, her quality of life, and her mobility.  She understands the risk of acute blood loss anemia, nerve and vessel injury, fracture, infection, and DVT.  PROCEDURE DESCRIPTION:  After informed consent was obtained, appropriate left leg was marked.  Anesthesia obtained regional adductor canal block. She was brought to the  operating room, inside up on the operating table. Spinal anesthesia was obtained.  She was then placed in supine position. A Foley catheter was placed.  A nonsterile tourniquet was placed around her upper left leg.  Her left leg was then prepped and draped from the thigh down to the toes with DuraPrep and sterile drapes including sterile stockinette.  Time-out was called.  She was identified as correct patient and correct left knee.  We then used an Esmarch to wrap out the leg and tourniquet was inflated to 300 mm of pressure.  I then made a direct midline incision over the patella and carried this proximally and distally.  We dissected down the knee joint and carried out a medial parapatellar arthrotomy and found significant synovitis in the knee as well as an effusion.  We put the knee in a flexed position and removed osteophytes from the knee as well as remnants of ACL, PCL, medial and lateral meniscus, and all loose bodies we could remove from the posterior joint capsule.  We set our tibial cut for taking 9 mm off the high side correcting for varus and valgus and a neutral slope.  This was off an extramedullary guide of the tibial crest.  We made this cut without difficulty.  We then went to the femur and used an intramedullary drill through the notch of the  femur.  An intramedullary guide setting this for 8 mm distal femoral cut for 5 degrees, externally rotated.  We made this cut without difficulty and brought the knee back down to full extension.  We removed more remnant space of the medial and lateral meniscus, then put our 9-mm extension block and we felt we achieved full extension and a good extension space and gap.  We then went back to the flexion.  We set our femoral sizing guide off the epicondylar axis for 3 degrees externally rotated and chose a size 3 femur.  We put our 4-in-1 cutting block for a size 3 femur and with the knee in a 90-degree flexed position, placed our 9  mm flexion block and we felt like we were balanced in flexion, extension, but we felt like an 11 may be better.  We then put our femoral box cut.  We made our femoral box cut without difficulty.  We then back to the tibia and chose a size 3 tibia.  We set the rotation off the femur and the tibial tubercle and chose a size 3 tibial tray.  We drilled for universal base plate and then made our keel punch as well.  With the trial 3 femur and the trial 3 tibial tray, we trialed 11 mm fix-bearing polyethylene insert, and we were pleased with stability and range of motion.  We then made our patellar cut and drilled 3 holes for size 29 patellar button.  We then removed all instrumentation from the knee.  We irrigated the knee with normal saline solution using pulsatile lavage.  We mixed our cement and cemented the real Stryker Triathlon tibial tray with universal base plate, size 3 followed by the real 3 femur.  We cleaned cement debris from the knee and placed our 11 mm fix-bearing polyethylene insert and cemented our patellar button.  Once the cement had hardened, we removed cement debris from the knee.  We let the tourniquet down and hemostasis was obtained with electrocautery.  We then irrigated the knee with normal saline solution once again.  Then, we closed our arthrotomy with interrupted #1 Vicryl suture followed by 0 Vicryl in the deep tissue, 2- 0 Vicryl in the subcutaneous tissue, 4-0 Monocryl subcuticular stitch, and Steri-Strips on the skin.  A Well-padded sterile dressing was applied.  She was taken to the recovery room in stable condition.  All final counts were correct.  There were no complications noted.  Of note, Erskine Emery, PA-C, assisted in the entire case.  His assistance was crucial for facilitating all aspects of this case.     Lind Guest. Ninfa Linden, M.D.     CYB/MEDQ  D:  08/26/2016  T:  08/26/2016  Job:  [id number]

## 2016-08-26 NOTE — H&P (Signed)
TOTAL KNEE ADMISSION H&P  Patient is being admitted for left total knee arthroplasty.  Subjective:  Chief Complaint:left knee pain.  HPI: Sherri Delgado, 71 y.o. female, has a history of pain and functional disability in the left knee due to arthritis and has failed non-surgical conservative treatments for greater than 12 weeks to includeNSAID's and/or analgesics, corticosteriod injections, viscosupplementation injections, flexibility and strengthening excercises, use of assistive devices, weight reduction as appropriate and activity modification.  Onset of symptoms was gradual, starting 3 years ago with gradually worsening course since that time. The patient noted no past surgery on the left knee(s).  Patient currently rates pain in the left knee(s) at 10 out of 10 with activity. Patient has night pain, worsening of pain with activity and weight bearing, pain that interferes with activities of daily living, pain with passive range of motion, crepitus and joint swelling.  Patient has evidence of subchondral sclerosis, periarticular osteophytes and joint space narrowing by imaging studies. There is no active infection.  Patient Active Problem List   Diagnosis Date Noted  . Unilateral primary osteoarthritis, right knee 07/26/2016  . Unilateral primary osteoarthritis, left knee 07/26/2016  . PCB (post coital bleeding) 03/31/2016  . Breast pain, right 05/19/2015  . Routine general medical examination at a health care facility 11/04/2014  . OBESITY 07/16/2008  . KNEE PAIN 07/16/2008  . HYPERGLYCEMIA, BORDERLINE 06/24/2006  . CERVICAL CANCER 05/10/2006  . Hyperlipidemia 05/10/2006  . HTN (hypertension) 05/10/2006  . Osteopenia 05/10/2006   Past Medical History:  Diagnosis Date  . Arthritis   . Depression    Hx of with divorce  . History of cervical cancer    s/p hysterectomyy. Most recent pap in 2009 normal  . Hyperlipidemia   . Hypertension   . Obesity   . Osteopenia    DEXA 08/2003, T  score lumbar -1.9    Past Surgical History:  Procedure Laterality Date  . ABDOMINAL HYSTERECTOMY  1998   In [place] "baby maker gone, playpen still there"  . COLONOSCOPY  2013   normal   . EYE SURGERY  2005   cataract surgery    Prescriptions Prior to Admission  Medication Sig Dispense Refill Last Dose  . amLODipine (NORVASC) 10 MG tablet Take 1 tablet (10 mg total) by mouth daily. 90 tablet 3 08/26/2016 at 0415  . diphenhydrAMINE (SOMINEX) 25 MG tablet Take 2 tablets (50 mg total) by mouth at bedtime. Patient uses this for sleep every night per patient (Patient taking differently: Take 75 mg by mouth at bedtime. ) 180 tablet 4 08/24/2016  . hydrochlorothiazide (HYDRODIURIL) 25 MG tablet Take 1 tablet (25 mg total) by mouth daily. 90 tablet 3 08/25/2016  . lisinopril (PRINIVIL,ZESTRIL) 40 MG tablet Take 1 tablet (40 mg total) by mouth daily. 90 tablet 3    Not on File  Social History  Substance Use Topics  . Smoking status: Never Smoker  . Smokeless tobacco: Never Used  . Alcohol use 0.6 oz/week    1 Glasses of wine per week     Comment: Occasional    Family History  Problem Relation Age of Onset  . Cancer Mother     died from brain tumor  . Cancer Father     died from liver cancer and cirrhosis  . Diabetes Brother   . Aneurysm Sister     died from brain aneurysm bleed  . Colon cancer Neg Hx   . Stomach cancer Neg Hx   . Rectal cancer Neg Hx   .  Colon polyps Neg Hx      ROS  Objective:  Physical Exam  Vital signs in last 24 hours: Temp:  [98.7 F (37.1 C)] 98.7 F (37.1 C) (02/23 0555) Pulse Rate:  [69] 69 (02/23 0555) Resp:  [16] 16 (02/23 0555) BP: (129)/(70) 129/70 (02/23 0555) SpO2:  [100 %] 100 % (02/23 0555) Weight:  [203 lb (92.1 kg)] 203 lb (92.1 kg) (02/23 0609)  Labs:   Estimated body mass index is 34.84 kg/m as calculated from the following:   Height as of this encounter: 5\' 4"  (1.626 m).   Weight as of this encounter: 203 lb (92.1 kg).   Imaging  Review Plain radiographs demonstrate severe degenerative joint disease of the left knee(s). The overall alignment ismild varus. The bone quality appears to be good for age and reported activity level.  Assessment/Plan:  End stage arthritis, left knee   The patient history, physical examination, clinical judgment of the provider and imaging studies are consistent with end stage degenerative joint disease of the left knee(s) and total knee arthroplasty is deemed medically necessary. The treatment options including medical management, injection therapy arthroscopy and arthroplasty were discussed at length. The risks and benefits of total knee arthroplasty were presented and reviewed. The risks due to aseptic loosening, infection, stiffness, patella tracking problems, thromboembolic complications and other imponderables were discussed. The patient acknowledged the explanation, agreed to proceed with the plan and consent was signed. Patient is being admitted for inpatient treatment for surgery, pain control, PT, OT, prophylactic antibiotics, VTE prophylaxis, progressive ambulation and ADL's and discharge planning. The patient is planning to be discharged to skilled nursing facility

## 2016-08-26 NOTE — Anesthesia Procedure Notes (Signed)
Anesthesia Regional Block: Adductor canal block   Pre-Anesthetic Checklist: ,, timeout performed, Correct Patient, Correct Site, Correct Laterality, Correct Procedure, Correct Position, site marked, Risks and benefits discussed, pre-op evaluation,  At surgeon's request and post-op pain management  Laterality: Left  Prep: Maximum Sterile Barrier Precautions used, chloraprep       Needles:  Injection technique: Single-shot  Needle Type: Echogenic Stimulator Needle     Needle Length: 9cm  Needle Gauge: 21     Additional Needles:   Procedures: ultrasound guided,,,,,,,,  Narrative:  Start time: 08/26/2016 7:07 AM End time: 08/26/2016 7:17 AM Injection made incrementally with aspirations every 5 mL. Anesthesiologist: Roderic Palau  Additional Notes: 2% Lidocaine skin wheel.

## 2016-08-27 LAB — CBC
HEMATOCRIT: 28.1 % — AB (ref 36.0–46.0)
Hemoglobin: 10 g/dL — ABNORMAL LOW (ref 12.0–15.0)
MCH: 28.9 pg (ref 26.0–34.0)
MCHC: 35.6 g/dL (ref 30.0–36.0)
MCV: 81.2 fL (ref 78.0–100.0)
PLATELETS: 184 10*3/uL (ref 150–400)
RBC: 3.46 MIL/uL — ABNORMAL LOW (ref 3.87–5.11)
RDW: 13.5 % (ref 11.5–15.5)
WBC: 10.2 10*3/uL (ref 4.0–10.5)

## 2016-08-27 MED ORDER — RIVAROXABAN 10 MG PO TABS: 10.0000 mg | ORAL_TABLET | Freq: Every day | ORAL | 0 refills | Status: DC

## 2016-08-27 MED ORDER — METHOCARBAMOL 500 MG PO TABS: 500.0000 mg | ORAL_TABLET | Freq: Four times a day (QID) | ORAL | 0 refills | Status: DC | PRN

## 2016-08-27 MED ORDER — OXYCODONE-ACETAMINOPHEN 5-325 MG PO TABS: 1.0000 | ORAL_TABLET | ORAL | 0 refills | Status: DC | PRN

## 2016-08-27 NOTE — Discharge Instructions (Signed)
INSTRUCTIONS AFTER JOINT REPLACEMENT  ° °o Remove items at home which could result in a fall. This includes throw rugs or furniture in walking pathways °o ICE to the affected joint every three hours while awake for 30 minutes at a time, for at least the first 3-5 days, and then as needed for pain and swelling.  Continue to use ice for pain and swelling. You may notice swelling that will progress down to the foot and ankle.  This is normal after surgery.  Elevate your leg when you are not up walking on it.   °o Continue to use the breathing machine you got in the hospital (incentive spirometer) which will help keep your temperature down.  It is common for your temperature to cycle up and down following surgery, especially at night when you are not up moving around and exerting yourself.  The breathing machine keeps your lungs expanded and your temperature down. ° ° °DIET:  As you were doing prior to hospitalization, we recommend a well-balanced diet. ° °DRESSING / WOUND CARE / SHOWERING ° °Keep the surgical dressing until follow up.  The dressing is water proof, so you can shower without any extra covering.  IF THE DRESSING FALLS OFF or the wound gets wet inside, change the dressing with sterile gauze.  Please use good hand washing techniques before changing the dressing.  Do not use any lotions or creams on the incision until instructed by your surgeon.   ° °ACTIVITY ° °o Increase activity slowly as tolerated, but follow the weight bearing instructions below.   °o No driving for 6 weeks or until further direction given by your physician.  You cannot drive while taking narcotics.  °o No lifting or carrying greater than 10 lbs. until further directed by your surgeon. °o Avoid periods of inactivity such as sitting longer than an hour when not asleep. This helps prevent blood clots.  °o You may return to work once you are authorized by your doctor.  ° ° ° °WEIGHT BEARING  ° °Weight bearing as tolerated with assist  device (walker, cane, etc) as directed, use it as long as suggested by your surgeon or therapist, typically at least 4-6 weeks. ° ° °EXERCISES ° °Results after joint replacement surgery are often greatly improved when you follow the exercise, range of motion and muscle strengthening exercises prescribed by your doctor. Safety measures are also important to protect the joint from further injury. Any time any of these exercises cause you to have increased pain or swelling, decrease what you are doing until you are comfortable again and then slowly increase them. If you have problems or questions, call your caregiver or physical therapist for advice.  ° °Rehabilitation is important following a joint replacement. After just a few days of immobilization, the muscles of the leg can become weakened and shrink (atrophy).  These exercises are designed to build up the tone and strength of the thigh and leg muscles and to improve motion. Often times heat used for twenty to thirty minutes before working out will loosen up your tissues and help with improving the range of motion but do not use heat for the first two weeks following surgery (sometimes heat can increase post-operative swelling).  ° °These exercises can be done on a training (exercise) mat, on the floor, on a table or on a bed. Use whatever works the best and is most comfortable for you.    Use music or television while you are exercising so that   the exercises are a pleasant break in your day. This will make your life better with the exercises acting as a break in your routine that you can look forward to.   Perform all exercises about fifteen times, three times per day or as directed.  You should exercise both the operative leg and the other leg as well. ° °Exercises include: °  °• Quad Sets - Tighten up the muscle on the front of the thigh (Quad) and hold for 5-10 seconds.   °• Straight Leg Raises - With your knee straight (if you were given a brace, keep it on),  lift the leg to 60 degrees, hold for 3 seconds, and slowly lower the leg.  Perform this exercise against resistance later as your leg gets stronger.  °• Leg Slides: Lying on your back, slowly slide your foot toward your buttocks, bending your knee up off the floor (only go as far as is comfortable). Then slowly slide your foot back down until your leg is flat on the floor again.  °• Angel Wings: Lying on your back spread your legs to the side as far apart as you can without causing discomfort.  °• Hamstring Strength:  Lying on your back, push your heel against the floor with your leg straight by tightening up the muscles of your buttocks.  Repeat, but this time bend your knee to a comfortable angle, and push your heel against the floor.  You may put a pillow under the heel to make it more comfortable if necessary.  ° °A rehabilitation program following joint replacement surgery can speed recovery and prevent re-injury in the future due to weakened muscles. Contact your doctor or a physical therapist for more information on knee rehabilitation.  ° ° °CONSTIPATION ° °Constipation is defined medically as fewer than three stools per week and severe constipation as less than one stool per week.  Even if you have a regular bowel pattern at home, your normal regimen is likely to be disrupted due to multiple reasons following surgery.  Combination of anesthesia, postoperative narcotics, change in appetite and fluid intake all can affect your bowels.  ° °YOU MUST use at least one of the following options; they are listed in order of increasing strength to get the job done.  They are all available over the counter, and you may need to use some, POSSIBLY even all of these options:   ° °Drink plenty of fluids (prune juice may be helpful) and high fiber foods °Colace 100 mg by mouth twice a day  °Senokot for constipation as directed and as needed Dulcolax (bisacodyl), take with full glass of water  °Miralax (polyethylene glycol)  once or twice a day as needed. ° °If you have tried all these things and are unable to have a bowel movement in the first 3-4 days after surgery call either your surgeon or your primary doctor.   ° °If you experience loose stools or diarrhea, hold the medications until you stool forms back up.  If your symptoms do not get better within 1 week or if they get worse, check with your doctor.  If you experience "the worst abdominal pain ever" or develop nausea or vomiting, please contact the office immediately for further recommendations for treatment. ° ° °ITCHING:  If you experience itching with your medications, try taking only a single pain pill, or even half a pain pill at a time.  You can also use Benadryl over the counter for itching or also to   help with sleep.   TED HOSE STOCKINGS:  Use stockings on both legs until for at least 2 weeks or as directed by physician office. They may be removed at night for sleeping.  MEDICATIONS:  See your medication summary on the After Visit Summary that nursing will review with you.  You may have some home medications which will be placed on hold until you complete the course of blood thinner medication.  It is important for you to complete the blood thinner medication as prescribed.  PRECAUTIONS:  If you experience chest pain or shortness of breath - call 911 immediately for transfer to the hospital emergency department.   If you develop a fever greater that 101 F, purulent drainage from wound, increased redness or drainage from wound, foul odor from the wound/dressing, or calf pain - CONTACT YOUR SURGEON.                                                   FOLLOW-UP APPOINTMENTS:  If you do not already have a post-op appointment, please call the office for an appointment to be seen by your surgeon.  Guidelines for how soon to be seen are listed in your After Visit Summary, but are typically between 1-4 weeks after surgery.  OTHER INSTRUCTIONS:   Knee  Replacement:  Do not place pillow under knee, focus on keeping the knee straight while resting. CPM instructions: 0-90 degrees, 2 hours in the morning, 2 hours in the afternoon, and 2 hours in the evening. Place foam block, curve side up under heel at all times except when in CPM or when walking.  DO NOT modify, tear, cut, or change the foam block in any way.  MAKE SURE YOU:   Understand these instructions.   Get help right away if you are not doing well or get worse.    Thank you for letting us be a part of your medical care team.  It is a privilege we respect greatly.  We hope these instructions will help you stay on track for a fast and full recovery!   Information on my medicine - XARELTO (Rivaroxaban)  This medication education was reviewed with me or my healthcare representative as part of my discharge preparation.  The pharmacist that spoke with me during my hospital stay was:  Minda Ditto, [facility]  Why was Xarelto prescribed for you? Xarelto was prescribed for you to reduce the risk of blood clots forming after orthopedic surgery. The medical term for these abnormal blood clots is venous thromboembolism (VTE).  What do you need to know about xarelto ? Take your Xarelto ONCE DAILY at the same time every day. You may take it either with or without food.  If you have difficulty swallowing the tablet whole, you may crush it and mix in applesauce just prior to taking your dose.  Take Xarelto exactly as prescribed by your doctor and DO NOT stop taking Xarelto without talking to the doctor who prescribed the medication.  Stopping without other VTE prevention medication to take the place of Xarelto may increase your risk of developing a clot.  After discharge, you should have regular check-up appointments with your healthcare provider that is prescribing your Xarelto.    What do you do if you miss a dose? If you miss a dose, take it as soon as you  remember on the same day then  continue your regularly scheduled once daily regimen the next day. Do not take two doses of Xarelto on the same day.   Important Safety Information A possible side effect of Xarelto is bleeding. You should call your healthcare provider right away if you experience any of the following: ? Bleeding from an injury or your nose that does not stop. ? Unusual colored urine (red or dark brown) or unusual colored stools (red or black). ? Unusual bruising for unknown reasons. ? A serious fall or if you hit your head (even if there is no bleeding).  Some medicines may interact with Xarelto and might increase your risk of bleeding while on Xarelto. To help avoid this, consult your healthcare provider or pharmacist prior to using any new prescription or non-prescription medications, including herbals, vitamins, non-steroidal anti-inflammatory drugs (NSAIDs) and supplements.  Patient does not take Aspirin, Ibuprofen or Alleve Discussed using Tylenol for mild pain, be aware of what pain meds ordered post-op that may also contain Tylenol  This website has more information on Xarelto: [email or web address]

## 2016-08-27 NOTE — Progress Notes (Signed)
Physical Therapy Treatment Patient Details Name: Sherri Delgado MRN: [id number] DOB: [birth year] Today's Date: 08/27/2016    History of Present Illness Pt s/p L TKR    PT Comments    Pt making steady progress with mobility.   Follow Up Recommendations  SNF     Equipment Recommendations  Rolling walker with 5" wheels    Recommendations for Other Services OT consult     Precautions / Restrictions Precautions Precautions: Fall;Knee Required Braces or Orthoses: Knee Immobilizer - Left Knee Immobilizer - Left: Discontinue once straight leg raise with < 10 degree lag Restrictions Weight Bearing Restrictions: No Other Position/Activity Restrictions: WBAT    Mobility  Bed Mobility Overal bed mobility: Needs Assistance Bed Mobility: Supine to Sit;Sit to Supine     Supine to sit: Min assist Sit to supine: Min assist   General bed mobility comments: cues for sequence and use of R LE to self assist  Transfers Overall transfer level: Needs assistance Equipment used: Rolling walker (2 wheeled) Transfers: Sit to/from Stand Sit to Stand: Min guard         General transfer comment: cues for LE management and use of UEs to self assist  Ambulation/Gait Ambulation/Gait assistance: Min assist;Min guard Ambulation Distance (Feet): 60 Feet (twice and 15 twice to/from bathroom) Assistive device: Rolling walker (2 wheeled) Gait Pattern/deviations: Step-to pattern;Decreased step length - right;Decreased step length - left;Shuffle;Trunk flexed Gait velocity: decr Gait velocity interpretation: Below normal speed for age/gender General Gait Details: cues for sequence, posture and position from [employer]            Wheelchair Mobility    Modified Rankin (Stroke Patients Only)       Balance                                    Cognition Arousal/Alertness: Awake/alert Behavior During Therapy: WFL for tasks assessed/performed Overall Cognitive Status:  Within Functional Limits for tasks assessed                      Exercises Total Joint Exercises Ankle Circles/Pumps: AROM;Both;15 reps;Supine Quad Sets: AROM;Both;10 reps;Supine Heel Slides: AAROM;Left;15 reps;Supine Straight Leg Raises: AAROM;Left;15 reps;Supine Goniometric ROM: AAROM L knee -10 - 35    General Comments        Pertinent Vitals/Pain Pain Assessment: 0-10 Pain Score: 4  Pain Location: L knee Pain Descriptors / Indicators: Aching;Sore Pain Intervention(s): Limited activity within patient's tolerance;Monitored during session;Premedicated before session;Ice applied    Home Living                      Prior Function            PT Goals (current goals can now be found in the care plan section) Acute Rehab PT Goals Patient Stated Goal: Regain IND PT Goal Formulation: With patient Time For Goal Achievement: 08/29/16 Potential to Achieve Goals: Good Progress towards PT goals: Progressing toward goals    Frequency    7X/week      PT Plan Current plan remains appropriate    Co-evaluation             End of Session Equipment Utilized During Treatment: Gait belt Activity Tolerance: Patient tolerated treatment well Patient left: in bed;with call bell/phone within reach Nurse Communication: Mobility status PT Visit Diagnosis: Difficulty in walking, not elsewhere classified (R26.2)     Time:  1430-1500 PT Time Calculation (min) (ACUTE ONLY): 30 min  Charges:  $Gait Training: 23-37 mins $Therapeutic Exercise: 8-22 mins                    G Codes:       Kemari Mares 2016-09-05, 4:40 PM

## 2016-08-27 NOTE — Progress Notes (Signed)
   Subjective:  Patient reports pain as mild.    Objective:   VITALS:   Vitals:   08/26/16 1758 08/26/16 2152 08/27/16 0247 08/27/16 0626  BP: (!) 136/98 135/80 113/60 (!) 112/50  Pulse: 69 77 64 64  Resp: 16 16 16 16   Temp: 98.8 F (37.1 C) 98.7 F (37.1 C) 98.3 F (36.8 C) 98.2 F (36.8 C)  TempSrc: Oral Oral Oral Oral  SpO2: 100% 99% 98% 97%  Weight:      Height:        Neurologically intact Neurovascular intact Sensation intact distally Intact pulses distally Dorsiflexion/Plantar flexion intact Incision: dressing C/D/I and no drainage No cellulitis present Compartment soft   Lab Results  Component Value Date   WBC 6.2 08/16/2016   HGB 12.7 08/16/2016   HCT 35.5 (L) 08/16/2016   MCV 81.6 08/16/2016   PLT 228 08/16/2016     Assessment/Plan:  1 Day Post-Op   - Expected postop acute blood loss anemia - will monitor for symptoms - Up with PT/OT - DVT ppx - SCDs, ambulation, xarelto - WBAT operative extremity - Pain control - Discharge planning  Eduard Roux 08/27/2016, 8:09 AM [phone]

## 2016-08-27 NOTE — Progress Notes (Signed)
Physical Therapy Treatment Patient Details Name: Sherri Delgado MRN: [id number] DOB: [birth year] Today's Date: 08/27/2016    History of Present Illness Pt s/p L TKR    PT Comments    Pt very motivated but reports very limited assist at home and no one to assist with therapy.  Pt would benefit from more intensive PT (SNF level vs HHPT)to push for gains at L knee 2* premorbid limitations in ROM.  Follow Up Recommendations  SNF     Equipment Recommendations  Rolling walker with 5" wheels    Recommendations for Other Services OT consult     Precautions / Restrictions Precautions Precautions: Fall;Knee Required Braces or Orthoses: Knee Immobilizer - Left Knee Immobilizer - Left: Discontinue once straight leg raise with < 10 degree lag Restrictions Weight Bearing Restrictions: No Other Position/Activity Restrictions: WBAT    Mobility  Bed Mobility               General bed mobility comments: OOB with nursing and requests back to bed  Transfers Overall transfer level: Needs assistance Equipment used: Rolling walker (2 wheeled) Transfers: Sit to/from Stand Sit to Stand: Min guard         General transfer comment: cues for LE management and use of UEs to self assist  Ambulation/Gait Ambulation/Gait assistance: Min assist Ambulation Distance (Feet): 60 Feet (twice) Assistive device: Rolling walker (2 wheeled) Gait Pattern/deviations: Step-to pattern;Decreased step length - right;Decreased step length - left;Shuffle;Trunk flexed Gait velocity: decr Gait velocity interpretation: Below normal speed for age/gender General Gait Details: cues for sequence, posture and position from [employer]            Wheelchair Mobility    Modified Rankin (Stroke Patients Only)       Balance                                    Cognition Arousal/Alertness: Awake/alert Behavior During Therapy: WFL for tasks assessed/performed Overall Cognitive Status:  Within Functional Limits for tasks assessed                      Exercises Total Joint Exercises Ankle Circles/Pumps: AROM;Both;15 reps;Supine Quad Sets: AROM;Both;10 reps;Supine Heel Slides: AAROM;Left;15 reps;Supine Straight Leg Raises: AAROM;Left;15 reps;Supine Goniometric ROM: AAROM L knee -10 - 35    General Comments        Pertinent Vitals/Pain Pain Assessment: 0-10 Pain Score: 5  Pain Location: L knee Pain Descriptors / Indicators: Aching;Sore Pain Intervention(s): Limited activity within patient's tolerance;Monitored during session;Premedicated before session;Ice applied    Home Living Family/patient expects to be discharged to:: Private residence Living Arrangements: Alone Available Help at Discharge: Available PRN/intermittently;Family;Friend(s) Type of Home: Apartment Home Access: Stairs to enter Entrance Stairs-Rails: Right;Left Home Layout: One level Home Equipment: Cane - single point;Toilet riser;Grab bars - tub/shower      Prior Function Level of Independence: Independent;Independent with assistive device(s)          PT Goals (current goals can now be found in the care plan section) Acute Rehab PT Goals Patient Stated Goal: Regain IND PT Goal Formulation: With patient Time For Goal Achievement: 08/29/16 Potential to Achieve Goals: Good Progress towards PT goals: Progressing toward goals    Frequency    7X/week      PT Plan Discharge plan needs to be updated    Co-evaluation  End of Session Equipment Utilized During Treatment: Gait belt;Left knee immobilizer Activity Tolerance: Patient tolerated treatment well Patient left: in chair;with call bell/phone within reach;with chair alarm set Nurse Communication: Mobility status PT Visit Diagnosis: Difficulty in walking, not elsewhere classified (R26.2)     Time: 0930-1000 PT Time Calculation (min) (ACUTE ONLY): 30 min  Charges:  $Gait Training: 8-22  mins $Therapeutic Exercise: 8-22 mins                    G Codes:       Rochell Puett 2016/09/07, 1:28 PM

## 2016-08-27 NOTE — Evaluation (Signed)
Occupational Therapy Evaluation Patient Details Name: Sherri Delgado MRN: [id number] DOB: 1945-08-11 Today's Date: 08/27/2016    History of Present Illness Pt s/p L TKR   Clinical Impression   Pt admitted with the above diagnoses and presents with below problem list. Pt will benefit from continued acute OT to address the below listed deficits and maximize independence with basic ADLs prior to d/c home. PTA pt was mod I to independent with ADLs. Pt is currently min A for LB ADLs, min guard for functional mobility and transfers.       Follow Up Recommendations  No OT follow up    Equipment Recommendations  3 in 1 bedside commode    Recommendations for Other Services       Precautions / Restrictions Precautions Precautions: Fall;Knee Required Braces or Orthoses: Knee Immobilizer - Left Knee Immobilizer - Left: Discontinue once straight leg raise with < 10 degree lag Restrictions Other Position/Activity Restrictions: WBAT      Mobility Bed Mobility               General bed mobility comments: up in chair  Transfers Overall transfer level: Needs assistance Equipment used: Rolling walker (2 wheeled) Transfers: Sit to/from Stand Sit to Stand: Min guard         General transfer comment: from recliner and 3n1. Cautious and some extra time but no physical assist. Used BUE support of arm rest to control descent into recliner.     Balance                                            ADL Overall ADL's : Needs assistance/impaired Eating/Feeding: Set up;Sitting   Grooming: Set up;Min guard;Sitting;Standing   Upper Body Bathing: Set up;Sitting   Lower Body Bathing: Minimal assistance;Sit to/from stand   Upper Body Dressing : Set up   Lower Body Dressing: Minimal assistance   Toilet Transfer: Min guard   Toileting- Clothing Manipulation and Hygiene: Set up;Supervision/safety;Min guard;Sit to/from stand;Sitting/lateral lean Toileting - Clothing  Manipulation Details (indicate cue type and reason): pt completed in seated position Tub/ Shower Transfer: Tub transfer;Minimal assistance   Functional mobility during ADLs: Min guard;Rolling walker General ADL Comments: Pt completed toilet transfer and pericare as detailed above.      Vision         Perception     Praxis      Pertinent Vitals/Pain Pain Assessment: 0-10 Pain Score: 10-Worst pain ever Pain Location: L knee Pain Descriptors / Indicators: Aching;Sore Pain Intervention(s): Limited activity within patient's tolerance;Monitored during session;Premedicated before session;Repositioned     Hand Dominance     Extremity/Trunk Assessment Upper Extremity Assessment Upper Extremity Assessment: Overall WFL for tasks assessed   Lower Extremity Assessment Lower Extremity Assessment: Defer to PT evaluation   Cervical / Trunk Assessment Cervical / Trunk Assessment: Normal   Communication Communication Communication: No difficulties   Cognition Arousal/Alertness: Awake/alert Behavior During Therapy: WFL for tasks assessed/performed Overall Cognitive Status: Within Functional Limits for tasks assessed                     General Comments       Exercises       Shoulder Instructions      Home Living Family/patient expects to be discharged to:: Private residence Living Arrangements: Alone Available Help at Discharge: Available PRN/intermittently;Family;Friend(s) Type of Home: Apartment Home Access:  Stairs to enter [employer] of Steps: 12 Entrance Stairs-Rails: Right;Left Home Layout: One level     Bathroom Shower/Tub: Teacher, early years/pre: Standard     Home Equipment: Cane - single point;Toilet riser;Grab bars - tub/shower          Prior Functioning/Environment Level of Independence: Independent;Independent with assistive device(s)                 OT Problem List: Impaired balance (sitting and/or  standing);Decreased knowledge of use of DME or AE;Decreased knowledge of precautions;Pain      OT Treatment/Interventions: Self-care/ADL training;DME and/or AE instruction;Therapeutic activities;Patient/family education;Balance training    OT Goals(Current goals can be found in the care plan section) Acute Rehab OT Goals Patient Stated Goal: Regain IND OT Goal Formulation: With patient Time For Goal Achievement: 09/03/16 Potential to Achieve Goals: Good ADL Goals Pt Will Perform Lower Body Bathing: with modified independence;with adaptive equipment;sit to/from stand Pt Will Perform Lower Body Dressing: with modified independence;with adaptive equipment;sit to/from stand Pt Will Transfer to Toilet: with modified independence;ambulating Pt Will Perform Toileting - Clothing Manipulation and hygiene: with modified independence;sit to/from stand Pt Will Perform Tub/Shower Transfer: Tub transfer;with supervision;ambulating;3 in 1;rolling walker  OT Frequency: Min 2X/week   Barriers to D/C:            Co-evaluation              End of Session Equipment Utilized During Treatment: Rolling walker;Other (comment) (per pt did not use KI with PT earlier) Nurse Communication: Other (comment) (nausea)  Activity Tolerance: Patient tolerated treatment well;Other (comment) (some nausea and reporting 10/10 pain, premedicated) Patient left: in chair;with call bell/phone within reach  OT Visit Diagnosis: Other abnormalities of gait and mobility (R26.89);Pain Pain - Right/Left: Left Pain - part of body: Knee                ADL either performed or assessed with clinical judgement  Time: [id number] OT Time Calculation (min): 31 min Charges:  OT General Charges $OT Visit: 1 Procedure OT Evaluation $OT Eval Low Complexity: 1 Procedure OT Treatments $Self Care/Home Management : 8-22 mins G-Codes:       Hortencia Pilar 08/27/2016, 11:18 AM

## 2016-08-28 NOTE — Clinical Social Work Placement (Signed)
   CLINICAL SOCIAL WORK PLACEMENT  NOTE  Date:  08/28/2016  Patient Details  Name: Sherri Delgado MRN: [id number] Date of Birth: 04/21/1946  Clinical Social Work is seeking post-discharge placement for this patient at the [place] level of care (*CSW will initial, date and re-position this form in  chart as items are completed):  No (List to be provided in the a.m. as this SW was off campus)   Patient/family provided with [place] Work Department's list of facilities offering this level of care within the geographic area requested by the patient (or if unable, by the patient's family).  Yes   Patient/family informed of their freedom to choose among providers that offer the needed level of care, that participate in Medicare, Medicaid or managed care program needed by the patient, have an available bed and are willing to accept the patient.  Yes   Patient/family informed of [facility]'s ownership interest in [facility] and [facility], as well as of the fact that they are under no obligation to receive care at these facilities.  PASRR submitted to EDS on 08/28/16     PASRR number received on 08/28/16     Existing PASRR number confirmed on       FL2 transmitted to all facilities in geographic area requested by pt/family on 08/28/16     FL2 transmitted to all facilities within larger geographic area on       Patient informed that his/her managed care company has contracts with or will negotiate with certain facilities, including the following:   [facility])         Patient/family informed of bed offers received.  Patient chooses bed at       Physician recommends and patient chooses bed at      Patient to be transferred to   on  .  Patient to be transferred to facility by       Patient family notified on   of transfer.  Name of family member notified:        PHYSICIAN Please prepare priority discharge summary, including  medications, Please prepare prescriptions, Please sign FL2     Additional Comment:    _______________________________________________ [name], LCSW 08/28/2016, 6:26 PM

## 2016-08-28 NOTE — Clinical Social Work Note (Signed)
Clinical Social Work Assessment  Patient Details  Name: Stevanna Karren MRN: [id number] Date of Birth: 1945/08/10  Date of referral:  08/28/16               Reason for consult:  Facility Placement                Permission sought to share information with:  Chartered certified accountant granted to share information::  Yes, Verbal Permission Granted   Housing/Transportation Living arrangements for the past 2 months:  [place] of Information:  Patient Patient Interpreter Needed:  None Criminal Activity/Legal Involvement Pertinent to Current Situation/Hospitalization:  No - Comment as needed Significant Relationships:  Siblings, Other Family Members (Neice) Lives with:  Self Do you feel safe going back to the place where you live?  Yes (After Rehab) Need for family participation in patient care:  No ([place])  Care giving concerns: Patient lives alone- was completely independent prior to knee surgery. Planned to return home- however PT recommended that short term rehab and patient agrees.   Social Worker assessment / plan: SW spoke with patient who indicated that she had hoped to return home after surgery but stated "my doctor feels I needs some rehab in the nursing home and I should do what he tells me to do"  PT is recommending SNF.  Patient lives alone and was independent of her ADLS prior to her surgery. Still drives etc.  Pt states she works and is very active. States "I know I'm 70 but I don't look it!"   Discussed bed search procedure. Patient would like placement at [facility] if possible; Spoke with Crecencio Mc- Admissions at [facility]- she stated that they are not in network with [employer] but would still offer on an [employer] patient but charge out of network rates.  Pt agreed to full SNF search; [employer] Medicare will require prior authorization.  Discussed with Edwin Cap, weekend [facility] as original plan was for patient to return home.  Patient may be stable for  d/c tomorrow. Awaiting decision per MD.    Employment status:  Retired (Still works as a Building control surveyor) Nurse, adult PT Recommendations:  [place] / Referral to community resources:  [place]  Patient/Family's Response to care:  Pt has a very positive attitude and feels that she will benefit from short term rehab as she lives alone. Feels she has done well after surgery.  Patient/Family's Understanding of and Emotional Response to Diagnosis, Current Treatment, and Prognosis:  Patient is alert, oriented and very invovled in her own medical care.  Exhibits a strong understanding of her medical condition and treatment needs.    Emotional Assessment Appearance:  Appears younger than stated age Attitude/Demeanor/Rapport:   (Calm, rational friendly, responsive, cooperative) Affect (typically observed):   (Appropriate, happy, pleasant, calm accepting,) Orientation:  Oriented to Self, Oriented to Place, Oriented to  Time, Oriented to Situation Alcohol / Substance use:  Alcohol Use (social only  never smoked) Psych involvement (Current and /or in the community):  No (Comment)  Discharge Needs  Concerns to be addressed:  Other (Comment Required (SNF placement will require authorization) Readmission within the last 30 days:  No Current discharge risk:  None Barriers to Discharge:  Ship broker, Continued Medical Work up   Estill Bakes 08/28/2016, 6:14 PM

## 2016-08-28 NOTE — Progress Notes (Signed)
   Subjective:  Patient reports pain as mild.    Objective:   VITALS:   Vitals:   08/27/16 1037 08/27/16 1411 08/27/16 2106 08/28/16 0457  BP: (!) 102/54 (!) 105/51 140/74 (!) 146/69  Pulse: 76 80 78 76  Resp: 16 16 15 17   Temp: 98.1 F (36.7 C) 98.6 F (37 C) 99.1 F (37.3 C) 98.9 F (37.2 C)  TempSrc: Oral Oral Oral Oral  SpO2: 98% 96% 97% 96%  Weight:      Height:        Neurologically intact Neurovascular intact Sensation intact distally Intact pulses distally Dorsiflexion/Plantar flexion intact Incision: dressing C/D/I and no drainage No cellulitis present Compartment soft   Lab Results  Component Value Date   WBC 10.2 08/27/2016   HGB 10.0 (L) 08/27/2016   HCT 28.1 (L) 08/27/2016   MCV 81.2 08/27/2016   PLT 184 08/27/2016     Assessment/Plan:  2 Days Post-Op   - Expected postop acute blood loss anemia - will monitor for symptoms - Up with PT/OT - DVT ppx - SCDs, ambulation, xarelto - WBAT operative extremity - Pain control - Discharge planning - SNF pending  Eduard Roux 08/28/2016, 12:03 PM [phone]

## 2016-08-28 NOTE — Progress Notes (Signed)
Physical Therapy Treatment Patient Details Name: Sherri Delgado MRN: [id number] DOB: [birth year] Today's Date: 08/28/2016    History of Present Illness Pt s/p L TKR    PT Comments    Pt motivated and progressing well with mobility.   Follow Up Recommendations  SNF     Equipment Recommendations  Rolling walker with 5" wheels    Recommendations for Other Services OT consult     Precautions / Restrictions Precautions Precautions: Fall;Knee Required Braces or Orthoses: Knee Immobilizer - Left Knee Immobilizer - Left: Discontinue once straight leg raise with < 10 degree lag Restrictions Weight Bearing Restrictions: No Other Position/Activity Restrictions: WBAT    Mobility  Bed Mobility Overal bed mobility: Needs Assistance Bed Mobility: Supine to Sit;Sit to Supine     Supine to sit: Min guard Sit to supine: Min guard   General bed mobility comments: min cues for sequence and use of R LE to self assist  Transfers Overall transfer level: Needs assistance Equipment used: Rolling walker (2 wheeled) Transfers: Sit to/from Stand Sit to Stand: Supervision         General transfer comment: Pt self cueing for LE management and use of UEs  Ambulation/Gait Ambulation/Gait assistance: Min guard Ambulation Distance (Feet): 95 Feet (twice and 15' back from bathroom) Assistive device: Rolling walker (2 wheeled) Gait Pattern/deviations: Step-to pattern;Step-through pattern;Decreased step length - right;Decreased step length - left;Shuffle Gait velocity: decr Gait velocity interpretation: Below normal speed for age/gender General Gait Details: min cues for posture and position from [employer]            Wheelchair Mobility    Modified Rankin (Stroke Patients Only)       Balance Overall balance assessment: No apparent balance deficits (not formally assessed)                                  Cognition Arousal/Alertness: Awake/alert Behavior  During Therapy: WFL for tasks assessed/performed Overall Cognitive Status: Within Functional Limits for tasks assessed                      Exercises Total Joint Exercises Ankle Circles/Pumps: AROM;Both;15 reps;Supine Quad Sets: AROM;Both;Supine;15 reps Heel Slides: AAROM;Left;Supine;20 reps Straight Leg Raises: Left;Supine;20 reps;AAROM;AROM    General Comments        Pertinent Vitals/Pain Pain Assessment: 0-10 Pain Score: 4  Pain Location: L knee Pain Descriptors / Indicators: Aching;Sore Pain Intervention(s): Limited activity within patient's tolerance;Monitored during session;Premedicated before session    Home Living                      Prior Function            PT Goals (current goals can now be found in the care plan section) Acute Rehab PT Goals Patient Stated Goal: Regain IND PT Goal Formulation: With patient Time For Goal Achievement: 08/29/16 Potential to Achieve Goals: Good Progress towards PT goals: Progressing toward goals    Frequency    7X/week      PT Plan Current plan remains appropriate    Co-evaluation             End of Session Equipment Utilized During Treatment: Gait belt Activity Tolerance: Patient tolerated treatment well Patient left: in bed;with call bell/phone within reach Nurse Communication: Mobility status PT Visit Diagnosis: Difficulty in walking, not elsewhere classified (R26.2)     Time: [id number] PT  Time Calculation (min) (ACUTE ONLY): 23 min  Charges:  $Gait Training: 23-37 mins $Therapeutic Exercise: 8-22 mins                    G Codes:       Martyna Thorns 30-Aug-2016, 2:46 PM

## 2016-08-28 NOTE — Progress Notes (Signed)
Physical Therapy Treatment Patient Details Name: Sherri Delgado MRN: [id number] DOB: March 03, 1946 Today's Date: 08/28/2016    History of Present Illness Pt s/p L TKR    PT Comments    Pt progressing steadily with mobility and eager for move to rehab setting.   Follow Up Recommendations  SNF     Equipment Recommendations  Rolling walker with 5" wheels    Recommendations for Other Services OT consult     Precautions / Restrictions Precautions Precautions: Fall;Knee Required Braces or Orthoses: Knee Immobilizer - Left Knee Immobilizer - Left: Discontinue once straight leg raise with < 10 degree lag Restrictions Weight Bearing Restrictions: No Other Position/Activity Restrictions: WBAT    Mobility  Bed Mobility Overal bed mobility: Needs Assistance Bed Mobility: Supine to Sit;Sit to Supine     Supine to sit: Min assist Sit to supine: Min guard   General bed mobility comments: cues for sequence and use of R LE to self assist  Transfers Overall transfer level: Needs assistance Equipment used: Rolling walker (2 wheeled) Transfers: Sit to/from Stand Sit to Stand: Min guard         General transfer comment: cues for LE management and use of UEs to self assist  Ambulation/Gait Ambulation/Gait assistance: Min assist;Min guard Ambulation Distance (Feet): 60 Feet (twice) Assistive device: Rolling walker (2 wheeled) Gait Pattern/deviations: Shuffle;Trunk flexed;Decreased step length - right;Decreased step length - left;Step-to pattern;Step-through pattern Gait velocity: decr Gait velocity interpretation: Below normal speed for age/gender General Gait Details: min cues for sequence, posture and position from [employer] Mobility    Modified Rankin (Stroke Patients Only)       Balance Overall balance assessment: No apparent balance deficits (not formally assessed)                                  Cognition  Arousal/Alertness: Awake/alert Behavior During Therapy: WFL for tasks assessed/performed Overall Cognitive Status: Within Functional Limits for tasks assessed                      Exercises Total Joint Exercises Ankle Circles/Pumps: AROM;Both;15 reps;Supine Quad Sets: AROM;Both;Supine;15 reps Heel Slides: AAROM;Left;Supine;20 reps Straight Leg Raises: Left;Supine;20 reps;AAROM;AROM    General Comments        Pertinent Vitals/Pain Pain Assessment: 0-10 Pain Score: 4  Pain Location: L knee Pain Descriptors / Indicators: Aching;Sore Pain Intervention(s): Limited activity within patient's tolerance;Monitored during session;Premedicated before session;Ice applied    Home Living                      Prior Function            PT Goals (current goals can now be found in the care plan section) Acute Rehab PT Goals Patient Stated Goal: Regain IND PT Goal Formulation: With patient Time For Goal Achievement: 08/29/16 Potential to Achieve Goals: Good Progress towards PT goals: Progressing toward goals    Frequency    7X/week      PT Plan Current plan remains appropriate    Co-evaluation             End of Session Equipment Utilized During Treatment: Gait belt Activity Tolerance: Patient tolerated treatment well Patient left: in bed;with call bell/phone within reach Nurse Communication: Mobility status PT Visit Diagnosis: Difficulty in walking, not elsewhere classified (R26.2)  Time: [id number] PT Time Calculation (min) (ACUTE ONLY): 35 min  Charges:  $Gait Training: 8-22 mins $Therapeutic Exercise: 8-22 mins                    G Codes:       Aslan Himes 09-24-2016, 12:30 PM

## 2016-08-28 NOTE — NC FL2 (Signed)
[employer] LEVEL OF CARE SCREENING TOOL     IDENTIFICATION  Patient Name: Sherri Delgado Birthdate: April 03, 1946 Sex: female Admission Date (Current Location): 08/26/2016  [facility] and [place] Number:  Herbalist and Address:  [facility],  [address], [place]      Provider Number: [id number]  Attending Physician Name and Address:  Mcarthur Rossetti, *  Relative Name and Phone Number:       Current Level of Care: Hospital Recommended Level of Care: [place] Prior Approval Number:    Date Approved/Denied:   PASRR Number: [id number]  Discharge Plan: SNF    Current Diagnoses: Patient Active Problem List   Diagnosis Date Noted  . Status post total left knee replacement 08/26/2016  . Unilateral primary osteoarthritis, right knee 07/26/2016  . Unilateral primary osteoarthritis, left knee 07/26/2016  . PCB (post coital bleeding) 03/31/2016  . Breast pain, right 05/19/2015  . Routine general medical examination at a health care facility 11/04/2014  . OBESITY 07/16/2008  . KNEE PAIN 07/16/2008  . HYPERGLYCEMIA, BORDERLINE 06/24/2006  . CERVICAL CANCER 05/10/2006  . Hyperlipidemia 05/10/2006  . HTN (hypertension) 05/10/2006  . Osteopenia 05/10/2006    Orientation RESPIRATION BLADDER Height & Weight     Self, Time, Situation, Place  Normal Continent Weight: 203 lb (92.1 kg) Height:  5\' 4"  (162.6 cm)  BEHAVIORAL SYMPTOMS/MOOD NEUROLOGICAL BOWEL NUTRITION STATUS      Continent Diet (Regular)  AMBULATORY STATUS COMMUNICATION OF NEEDS Skin   Extensive Assist Verbally Surgical wounds (Left knee incision- dsg in place)                       Personal Care Assistance Level of Assistance  Dressing, Bathing Bathing Assistance: Limited assistance   Dressing Assistance: Limited assistance     Functional Limitations Info             SPECIAL CARE FACTORS FREQUENCY  PT (By licensed PT), OT (By  licensed OT)     PT Frequency: 5 OT Frequency: 5            Contractures Contractures Info: Not present    Additional Factors Info  Code Status, Allergies Code Status Info: Full Allergies Info: NKA           Current Medications (08/28/2016):  This is the current hospital active medication list Current Facility-Administered Medications  Medication Dose Route Frequency Provider Last Rate Last Dose  . 0.9 %  sodium chloride infusion   Intravenous Continuous Mcarthur Rossetti, MD   Stopped at 08/27/16 1100  . acetaminophen (TYLENOL) tablet 650 mg  650 mg Oral Q6H PRN Mcarthur Rossetti, MD   650 mg at 08/27/16 [id number]   Or  . acetaminophen (TYLENOL) suppository 650 mg  650 mg Rectal Q6H PRN Mcarthur Rossetti, MD      . alum & mag hydroxide-simeth (MAALOX/MYLANTA) 200-200-20 MG/5ML suspension 30 mL  30 mL Oral Q4H PRN Mcarthur Rossetti, MD      . amLODipine (NORVASC) tablet 10 mg  10 mg Oral Daily Mcarthur Rossetti, MD   10 mg at 08/28/16 1005  . celecoxib (CELEBREX) capsule 200 mg  200 mg Oral Q12H Mcarthur Rossetti, MD   200 mg at 08/28/16 1005  . diphenhydrAMINE (BENADRYL) 12.5 MG/5ML elixir 12.5-25 mg  12.5-25 mg Oral Q4H PRN Mcarthur Rossetti, MD      . docusate sodium (COLACE) capsule 100 mg  100  mg Oral BID Mcarthur Rossetti, MD   100 mg at 08/28/16 1005  . hydrochlorothiazide (HYDRODIURIL) tablet 25 mg  25 mg Oral Daily Mcarthur Rossetti, MD   25 mg at 08/28/16 1005  . HYDROmorphone (DILAUDID) injection 1 mg  1 mg Intravenous Q2H PRN Mcarthur Rossetti, MD   1 mg at 08/27/16 1345  . lisinopril (PRINIVIL,ZESTRIL) tablet 40 mg  40 mg Oral Daily Mcarthur Rossetti, MD   40 mg at 08/28/16 1005  . menthol-cetylpyridinium (CEPACOL) lozenge 3 mg  1 lozenge Oral PRN Mcarthur Rossetti, MD       Or  . phenol (CHLORASEPTIC) mouth spray 1 spray  1 spray Mouth/Throat PRN Mcarthur Rossetti, MD      . methocarbamol (ROBAXIN) tablet  500 mg  500 mg Oral Q6H PRN Mcarthur Rossetti, MD   500 mg at 08/28/16 0758   Or  . methocarbamol (ROBAXIN) 500 mg in dextrose 5 % 50 mL IVPB  500 mg Intravenous Q6H PRN Mcarthur Rossetti, MD      . metoCLOPramide (REGLAN) tablet 5-10 mg  5-10 mg Oral Q8H PRN Mcarthur Rossetti, MD       Or  . metoCLOPramide (REGLAN) injection 5-10 mg  5-10 mg Intravenous Q8H PRN Mcarthur Rossetti, MD      . ondansetron [facility]) tablet 4 mg  4 mg Oral Q6H PRN Mcarthur Rossetti, MD       Or  . ondansetron [facility]) injection 4 mg  4 mg Intravenous Q6H PRN Mcarthur Rossetti, MD   4 mg at 08/27/16 1059  . oxyCODONE (Oxy IR/ROXICODONE) immediate release tablet 5-10 mg  5-10 mg Oral Q3H PRN Mcarthur Rossetti, MD   5 mg at 08/28/16 0758  . polyethylene glycol (MIRALAX / GLYCOLAX) packet 17 g  17 g Oral Daily PRN Mcarthur Rossetti, MD      . rivaroxaban [facility]) tablet 10 mg  10 mg Oral Q breakfast Mcarthur Rossetti, MD   10 mg at 08/28/16 0751  . zolpidem (AMBIEN) tablet 5 mg  5 mg Oral QHS PRN Mcarthur Rossetti, MD         Discharge Medications: Please see discharge summary for a list of discharge medications.  Relevant Imaging Results:  Relevant Lab Results:   Additional Information SSN:  [id number]  Short term Rehab  [name], [place]

## 2016-08-28 NOTE — Care Management Note (Signed)
Case Management Note  Patient Details  Name: Sherri Delgado MRN: [id number] Date of Birth: 1945/11/07  Subjective/Objective:   S/p L TKR                 Action/Plan: Discharge Planning: 08/27/2016 RW ordered for scheduled dc home. Contacted [facility] for RW for planned dc home. Will continue to follow for dc needs.   08/28/2016 PT has recommended SNF -rehab. NCM spoke to pt and she is agreeable to SNF rehab. Contacted CSW with new referral.   PCP Norman Herrlich MD  Expected Discharge Date:  08/29/2016              Expected Discharge Plan:  [place]  In-House Referral:  Clinical Social Work  Discharge planning Services  CM Consult  Post Acute Care Choice:  NA Choice offered to:  NA  DME Arranged:  Walker rolling DME Agency:  [place] Arranged:  NA [place] Agency:  NA  Status of Service:  Completed, signed off  If discussed at [place] of Stay Meetings, dates discussed:    Additional Comments:  Erenest Rasher, RN 08/28/2016, 1:35 PM

## 2016-08-29 NOTE — Progress Notes (Signed)
Physical Therapy Treatment Patient Details Name: Sherri Delgado MRN: [id number] DOB: [birth year] Today's Date: 08/29/2016    History of Present Illness Pt s/p L TKR    PT Comments    Pt motivated and progressing well with mobility.  Limited this am by onset nausea.   Follow Up Recommendations  SNF     Equipment Recommendations  Rolling walker with 5" wheels    Recommendations for Other Services OT consult     Precautions / Restrictions Precautions Precautions: Fall;Knee Required Braces or Orthoses: Knee Immobilizer - Left Knee Immobilizer - Left: Discontinue once straight leg raise with < 10 degree lag Restrictions Weight Bearing Restrictions: No Other Position/Activity Restrictions: WBAT    Mobility  Bed Mobility Overal bed mobility: Needs Assistance Bed Mobility: Supine to Sit     Supine to sit: Modified independent (Device/Increase time)     General bed mobility comments: Increased time, pt self assisting L LE with UEs  Transfers Overall transfer level: Needs assistance Equipment used: Rolling walker (2 wheeled) Transfers: Sit to/from Stand Sit to Stand: Supervision         General transfer comment: Pt self cueing for LE management and use of UEs  Ambulation/Gait Ambulation/Gait assistance: Supervision Ambulation Distance (Feet): 140 Feet Assistive device: Rolling walker (2 wheeled) Gait Pattern/deviations: Step-to pattern;Step-through pattern;Decreased step length - right;Decreased step length - left;Shuffle Gait velocity: decr Gait velocity interpretation: Below normal speed for age/gender General Gait Details: min cues for posture and position from RW   Stairs Stairs:  (Pt declines stairs - states she has second entrance without )          Wheelchair Mobility    Modified Rankin (Stroke Patients Only)       Balance Overall balance assessment: No apparent balance deficits (not formally assessed)                                  Cognition Arousal/Alertness: Awake/alert Behavior During Therapy: WFL for tasks assessed/performed Overall Cognitive Status: Within Functional Limits for tasks assessed                      Exercises      General Comments        Pertinent Vitals/Pain Pain Assessment: 0-10 Pain Score: 3  Pain Location: L knee Pain Descriptors / Indicators: Aching;Sore Pain Intervention(s): Limited activity within patient's tolerance;Monitored during session;Premedicated before session;Ice applied    Home Living                      Prior Function            PT Goals (current goals can now be found in the care plan section) Acute Rehab PT Goals Patient Stated Goal: Regain IND PT Goal Formulation: With patient Time For Goal Achievement: 08/29/16 Potential to Achieve Goals: Good Progress towards PT goals: Progressing toward goals    Frequency    7X/week      PT Plan Discharge plan needs to be updated    Co-evaluation             End of Session Equipment Utilized During Treatment: Gait belt Activity Tolerance: Patient tolerated treatment well Patient left: in chair;with call bell/phone within reach Nurse Communication: Mobility status;Other (comment) (pt nauseous) PT Visit Diagnosis: Difficulty in walking, not elsewhere classified (R26.2)     Time: [id number] PT Time Calculation (min) (ACUTE ONLY): 17 min  Charges:  $Gait Training: 8-22 mins                    G Codes:       Sherri Delgado 09/11/2016, 10:39 AM

## 2016-08-29 NOTE — Progress Notes (Signed)
Occupational Therapy Treatment Patient Details Name: Sherri Delgado MRN: [id number] DOB: 1945/12/13 Today's Date: 08/29/2016    History of present illness Pt s/p L TKR   OT comments  Pt feels ready to go home and her family will A as needed  Follow Up Recommendations  No OT follow up    Equipment Recommendations  3 in 1 bedside commode    Recommendations for Other Services      Precautions / Restrictions Precautions Precautions: Fall;Knee Required Braces or Orthoses: Knee Immobilizer - Left Knee Immobilizer - Left: Discontinue once straight leg raise with < 10 degree lag Restrictions Weight Bearing Restrictions: No Other Position/Activity Restrictions: WBAT       Mobility Bed Mobility Overal bed mobility: Needs Assistance Bed Mobility: Supine to Sit     Supine to sit: Modified independent (Device/Increase time)     General bed mobility comments: pt in chair  Transfers Overall transfer level: Needs assistance Equipment used: Rolling walker (2 wheeled) Transfers: Sit to/from [facility] Sit to Stand: Supervision Stand pivot transfers: Supervision       General transfer comment: Pt self cueing for LE management and use of UEs    Balance Overall balance assessment: No apparent balance deficits (not formally assessed)                                 ADL Overall ADL's : Needs assistance/impaired Eating/Feeding: Set up;Sitting   Grooming: Standing;Cueing for safety;Cueing for sequencing;Set up           Upper Body Dressing : Set up;Sitting   Lower Body Dressing: Minimal assistance;Sit to/from stand;Cueing for sequencing;Cueing for safety Lower Body Dressing Details (indicate cue type and reason): pt would benefit from a reacher Toilet Transfer: Min guard;RW;Ambulation   Toileting- Clothing Manipulation and Hygiene: Supervision/safety;Sit to/from stand;Cueing for safety;Cueing for sequencing     Tub/Shower Transfer Details  (indicate cue type and reason): verbalized safety Functional mobility during ADLs: Supervision/safety;Cueing for sequencing;Cueing for safety              Praxis      Cognition   Behavior During Therapy: [facility] for tasks assessed/performed Overall Cognitive Status: Within Functional Limits for tasks assessed                                    Pertinent Vitals/ Pain       Pain Assessment: 0-10 Pain Score: 3  Pain Location: L knee Pain Descriptors / Indicators: Aching;Sore Pain Intervention(s): Monitored during session         Frequency  Min 2X/week        Progress Toward Goals  OT Goals(current goals can now be found in the care plan section)     Acute Rehab OT Goals Patient Stated Goal: Regain IND  Plan Discharge plan remains appropriate    Co-evaluation                 End of Session Equipment Utilized During Treatment: Rolling walker;Other (comment) (per pt did not use KI with PT earlier) CPM Left Knee CPM Left Knee: Off  OT Visit Diagnosis: Other abnormalities of gait and mobility (R26.89);Pain Pain - Right/Left: Left Pain - part of body: Knee   Activity Tolerance Patient tolerated treatment well;Other (comment) (some nausea and reporting 10/10 pain, premedicated)   Patient Left in chair;with call bell/phone within reach  Nurse Communication Other (comment) (nausea)        Time: [id number] OT Time Calculation (min): 8 min  Charges: OT General Charges $OT Visit: 1 Procedure OT Treatments $Self Care/Home Management : 8-22 mins  [place], [place]   Payton Mccallum D 08/29/2016, 11:08 AM

## 2016-08-29 NOTE — Progress Notes (Signed)
Patient ID: Sherri Delgado, female   DOB: December 07, 1945, 71 y.o.   MRN: [id number] Doing well overall.  Can be discharged to skilled nursing today.

## 2016-08-29 NOTE — Progress Notes (Signed)
Discharge planning, spoke with patient at beside. Chose [facility] for [facility] services, contacted [facility] for referral. RW has been delivered to room by [facility]. [phone]

## 2016-08-29 NOTE — Progress Notes (Signed)
Physical Therapy Treatment Patient Details Name: Sherri Delgado MRN: [id number] DOB: [birth year] Today's Date: 08/29/2016    History of Present Illness Pt s/p L TKR    PT Comments    Pt progressing well and eager for dc home.  Advanced therex and reviewed home therex program.   Follow Up Recommendations  SNF     Equipment Recommendations  Rolling walker with 5" wheels    Recommendations for Other Services OT consult     Precautions / Restrictions Precautions Precautions: Fall;Knee Required Braces or Orthoses: Knee Immobilizer - Left Knee Immobilizer - Left: Discontinue once straight leg raise with < 10 degree lag Restrictions Weight Bearing Restrictions: No Other Position/Activity Restrictions: WBAT    Mobility  Bed Mobility Overal bed mobility: Needs Assistance Bed Mobility: Supine to Sit     Supine to sit: Modified independent (Device/Increase time)     General bed mobility comments: Increased time, pt self assisting L LE with UEs  Transfers Overall transfer level: Needs assistance Equipment used: Rolling walker (2 wheeled) Transfers: Sit to/from Stand Sit to Stand: Supervision         General transfer comment: Pt self cueing for LE management and use of UEs  Ambulation/Gait Ambulation/Gait assistance: Supervision Ambulation Distance (Feet): 140 Feet Assistive device: Rolling walker (2 wheeled) Gait Pattern/deviations: Step-to pattern;Step-through pattern;Decreased step length - right;Decreased step length - left;Shuffle Gait velocity: decr Gait velocity interpretation: Below normal speed for age/gender General Gait Details: min cues for posture and position from RW   Stairs Stairs:  (Pt declines stairs - states she has second entrance without )          Wheelchair Mobility    Modified Rankin (Stroke Patients Only)       Balance Overall balance assessment: No apparent balance deficits (not formally assessed)                                   Cognition Arousal/Alertness: Awake/alert Behavior During Therapy: WFL for tasks assessed/performed Overall Cognitive Status: Within Functional Limits for tasks assessed                      Exercises Total Joint Exercises Ankle Circles/Pumps: AROM;Both;15 reps;Supine Quad Sets: AROM;Both;Supine;15 reps Heel Slides: AAROM;Left;Supine;20 reps (pt self assisting with sheet) Straight Leg Raises: Left;Supine;20 reps;AAROM;AROM (pt self assisting with sheet) Long Arc Quad: AAROM;AROM;20 reps;Seated;Left Goniometric ROM: AAROM L knee -5 - 40    General Comments        Pertinent Vitals/Pain Pain Assessment: 0-10 Pain Score: 5  Pain Location: L knee Pain Descriptors / Indicators: Aching;Sore Pain Intervention(s): Limited activity within patient's tolerance;Monitored during session;Premedicated before session;Ice applied    Home Living                      Prior Function            PT Goals (current goals can now be found in the care plan section) Acute Rehab PT Goals Patient Stated Goal: Regain IND PT Goal Formulation: With patient Time For Goal Achievement: 08/29/16 Potential to Achieve Goals: Good Progress towards PT goals: Progressing toward goals    Frequency    7X/week      PT Plan Current plan remains appropriate    Co-evaluation             End of Session Equipment Utilized During Treatment: Gait belt Activity Tolerance: Patient tolerated  treatment well Patient left: in chair;with call bell/phone within reach Nurse Communication: Mobility status;Other (comment) PT Visit Diagnosis: Difficulty in walking, not elsewhere classified (R26.2)     Time: [id number] PT Time Calculation (min) (ACUTE ONLY): 32 min  Charges:  $Gait Training: 8-22 mins $Therapeutic Exercise: 23-37 mins                    G Codes:       Raksha Wolfgang Sep 19, 2016, 10:45 AM

## 2016-08-29 NOTE — Discharge Summary (Signed)
Patient ID: Sherri Delgado MRN: [id number] DOB/AGE: Sep 13, 1945 71 y.o.  Admit date: 08/26/2016 Discharge date: 08/29/2016  Admission Diagnoses:  Principal Problem:   Unilateral primary osteoarthritis, left knee Active Problems:   Status post total left knee replacement   Discharge Diagnoses:  Same  Past Medical History:  Diagnosis Date  . Arthritis   . Depression    Hx of with divorce  . History of cervical cancer    s/p hysterectomyy. Most recent pap in 2009 normal  . Hyperlipidemia   . Hypertension   . Obesity   . Osteopenia    DEXA 08/2003, T score lumbar -1.9    Surgeries: Procedure(s): LEFT TOTAL KNEE ARTHROPLASTY on 08/26/2016   Consultants:   Discharged Condition: Improved  Hospital Course: Sherri Delgado is an 71 y.o. female who was admitted 08/26/2016 for operative treatment ofUnilateral primary osteoarthritis, left knee. Patient has severe unremitting pain that affects sleep, daily activities, and work/hobbies. After pre-op clearance the patient was taken to the operating room on 08/26/2016 and underwent  Procedure(s): LEFT TOTAL KNEE ARTHROPLASTY.    Patient was given perioperative antibiotics: Anti-infectives    Start     Dose/Rate Route Frequency Ordered Stop   08/26/16 1230  ceFAZolin (ANCEF) IVPB 1 g/50 mL premix     1 g 100 mL/hr over 30 Minutes Intravenous Every 6 hours 08/26/16 1114 08/26/16 1939   08/26/16 0556  ceFAZolin (ANCEF) IVPB 2g/100 mL premix     2 g 200 mL/hr over 30 Minutes Intravenous On call to O.R. 08/26/16 [id number] 08/26/16 [id number]       Patient was given sequential compression devices, early ambulation, and chemoprophylaxis to prevent DVT.  Patient benefited maximally from hospital stay and there were no complications.    Recent vital signs: Patient Vitals for the past 24 hrs:  BP Temp Temp src Pulse Resp SpO2  08/29/16 0535 (!) 121/53 98.6 F (37 C) Oral 64 16 100 %  08/28/16 2026 (!) 111/47 98.6 F (37 C) Oral 74 17 92 %  08/28/16  1356 110/65 98.3 F (36.8 C) Oral 72 16 96 %     Recent laboratory studies:  Recent Labs  08/27/16 1016  WBC 10.2  HGB 10.0*  HCT 28.1*  PLT 184     Discharge Medications:   Allergies as of 08/29/2016   No Known Allergies     Medication List    TAKE these medications   amLODipine 10 MG tablet Commonly known as:  NORVASC Take 1 tablet (10 mg total) by mouth daily.   diphenhydrAMINE 25 MG tablet Commonly known as:  SOMINEX Take 2 tablets (50 mg total) by mouth at bedtime. Patient uses this for sleep every night per patient What changed:  how much to take  additional instructions   hydrochlorothiazide 25 MG tablet Commonly known as:  HYDRODIURIL Take 1 tablet (25 mg total) by mouth daily.   lisinopril 40 MG tablet Commonly known as:  PRINIVIL,ZESTRIL Take 1 tablet (40 mg total) by mouth daily.   methocarbamol 500 MG tablet Commonly known as:  ROBAXIN Take 1 tablet (500 mg total) by mouth every 6 (six) hours as needed for muscle spasms.   oxyCODONE-acetaminophen 5-325 MG tablet Commonly known as:  ROXICET Take 1-2 tablets by mouth every 4 (four) hours as needed.   rivaroxaban 10 MG Tabs tablet Commonly known as:  XARELTO Take 1 tablet (10 mg total) by mouth daily with breakfast.            Durable Medical  Equipment        Start     Ordered   08/26/16 1115  DME 3 n 1  Once     08/26/16 1114   08/26/16 1115  DME Walker rolling  Once    Question:  Patient needs a walker to treat with the following condition  Answer:  Status post total left knee replacement   08/26/16 1114      Diagnostic Studies: Dg Knee Left Port  Result Date: 08/26/2016 CLINICAL DATA:  Left total knee replacement. EXAM: PORTABLE LEFT KNEE - 1-2 VIEW COMPARISON:  None. FINDINGS: Left total knee arthroplasty has been performed. The knee is located. Gas fluid are present in the joint as expected. Loose bodies are again seen within the popliteal fossa, presumably within a Baker cyst.  IMPRESSION: 1. Status post left total knee arthroplasty without radiographic evidence for complication. 2. Stable appearance of loose bodies within the posterior joint, presumably within a Baker's cyst. These results will be called to the ordering clinician or representative by the Radiologist Assistant, and communication documented in the PACS or zVision Dashboard. Electronically Signed   By: San Morelle M.D.   On: 08/26/2016 10:04    Disposition:  To skilled nursing facility  Discharge Instructions    Discharge patient    Complete by:  As directed    Discharge disposition:  03-Skilled [place]   Discharge patient date:  08/29/2016      Follow-up Information    Mcarthur Rossetti, MD Follow up in 2 week(s).   Specialty:  Orthopedic Surgery Contact information: [place] [address] [phone]            Signed: Mcarthur Rossetti 08/29/2016, 7:19 AM

## 2016-08-29 NOTE — Addendum Note (Signed)
Addendum  created 08/29/16 [id number] by Lollie Sails, CRNA   Charge Capture section accepted

## 2016-08-29 NOTE — Progress Notes (Signed)
CSW met with pt this am to assist with d/c planning. Pt is ready for d/c today. Pt has [employer]. It is unlikely insurance will authorize SNF placement as pt is ambulating 95 ft min guard. CSW offered to begin authorization process.It can take up to 2 days for a decision to be made by insurance. Pt has requested to d/c home today. PT / Nsg feel pt is safe to return home with HHPT. Nsg will alert MD. RNCM will assist with d/c planning home.  Werner Lean LCSW [phone]

## 2016-08-30 ENCOUNTER — Telehealth (INDEPENDENT_AMBULATORY_CARE_PROVIDER_SITE_OTHER): Payer: Self-pay | Admitting: Orthopaedic Surgery

## 2016-08-30 DIAGNOSIS — Z471 Aftercare following joint replacement surgery: Secondary | ICD-10-CM | POA: Diagnosis not present

## 2016-08-30 DIAGNOSIS — E669 Obesity, unspecified: Secondary | ICD-10-CM | POA: Diagnosis not present

## 2016-08-30 DIAGNOSIS — I10 Essential (primary) hypertension: Secondary | ICD-10-CM | POA: Diagnosis not present

## 2016-08-30 DIAGNOSIS — M1711 Unilateral primary osteoarthritis, right knee: Secondary | ICD-10-CM | POA: Diagnosis not present

## 2016-08-30 DIAGNOSIS — Z96652 Presence of left artificial knee joint: Secondary | ICD-10-CM | POA: Diagnosis not present

## 2016-08-30 DIAGNOSIS — E785 Hyperlipidemia, unspecified: Secondary | ICD-10-CM | POA: Diagnosis not present

## 2016-08-30 DIAGNOSIS — Z7982 Long term (current) use of aspirin: Secondary | ICD-10-CM | POA: Diagnosis not present

## 2016-08-30 DIAGNOSIS — M150 Primary generalized (osteo)arthritis: Secondary | ICD-10-CM | POA: Diagnosis not present

## 2016-08-30 NOTE — Telephone Encounter (Signed)
325 mg aspirin twice daily

## 2016-08-30 NOTE — Telephone Encounter (Signed)
Patient aware of the below message  

## 2016-08-30 NOTE — Telephone Encounter (Signed)
Home health Phys therapist calling to ask if there's an alternative to the Xarelto she was prescribed as it is too expensive.  [phone]

## 2016-08-30 NOTE — Telephone Encounter (Signed)
Please advise 

## 2016-08-31 DIAGNOSIS — E785 Hyperlipidemia, unspecified: Secondary | ICD-10-CM | POA: Diagnosis not present

## 2016-08-31 DIAGNOSIS — Z471 Aftercare following joint replacement surgery: Secondary | ICD-10-CM | POA: Diagnosis not present

## 2016-08-31 DIAGNOSIS — M150 Primary generalized (osteo)arthritis: Secondary | ICD-10-CM | POA: Diagnosis not present

## 2016-08-31 DIAGNOSIS — M1711 Unilateral primary osteoarthritis, right knee: Secondary | ICD-10-CM | POA: Diagnosis not present

## 2016-08-31 DIAGNOSIS — I10 Essential (primary) hypertension: Secondary | ICD-10-CM | POA: Diagnosis not present

## 2016-08-31 DIAGNOSIS — Z7982 Long term (current) use of aspirin: Secondary | ICD-10-CM | POA: Diagnosis not present

## 2016-08-31 DIAGNOSIS — Z96652 Presence of left artificial knee joint: Secondary | ICD-10-CM | POA: Diagnosis not present

## 2016-08-31 DIAGNOSIS — E669 Obesity, unspecified: Secondary | ICD-10-CM | POA: Diagnosis not present

## 2016-09-01 DIAGNOSIS — M1711 Unilateral primary osteoarthritis, right knee: Secondary | ICD-10-CM | POA: Diagnosis not present

## 2016-09-01 DIAGNOSIS — Z7982 Long term (current) use of aspirin: Secondary | ICD-10-CM | POA: Diagnosis not present

## 2016-09-01 DIAGNOSIS — I10 Essential (primary) hypertension: Secondary | ICD-10-CM | POA: Diagnosis not present

## 2016-09-01 DIAGNOSIS — E669 Obesity, unspecified: Secondary | ICD-10-CM | POA: Diagnosis not present

## 2016-09-01 DIAGNOSIS — E785 Hyperlipidemia, unspecified: Secondary | ICD-10-CM | POA: Diagnosis not present

## 2016-09-01 DIAGNOSIS — M150 Primary generalized (osteo)arthritis: Secondary | ICD-10-CM | POA: Diagnosis not present

## 2016-09-01 DIAGNOSIS — Z96652 Presence of left artificial knee joint: Secondary | ICD-10-CM | POA: Diagnosis not present

## 2016-09-01 DIAGNOSIS — Z471 Aftercare following joint replacement surgery: Secondary | ICD-10-CM | POA: Diagnosis not present

## 2016-09-05 DIAGNOSIS — Z7982 Long term (current) use of aspirin: Secondary | ICD-10-CM | POA: Diagnosis not present

## 2016-09-05 DIAGNOSIS — E669 Obesity, unspecified: Secondary | ICD-10-CM | POA: Diagnosis not present

## 2016-09-05 DIAGNOSIS — M1711 Unilateral primary osteoarthritis, right knee: Secondary | ICD-10-CM | POA: Diagnosis not present

## 2016-09-05 DIAGNOSIS — E785 Hyperlipidemia, unspecified: Secondary | ICD-10-CM | POA: Diagnosis not present

## 2016-09-05 DIAGNOSIS — Z471 Aftercare following joint replacement surgery: Secondary | ICD-10-CM | POA: Diagnosis not present

## 2016-09-05 DIAGNOSIS — M150 Primary generalized (osteo)arthritis: Secondary | ICD-10-CM | POA: Diagnosis not present

## 2016-09-05 DIAGNOSIS — I10 Essential (primary) hypertension: Secondary | ICD-10-CM | POA: Diagnosis not present

## 2016-09-05 DIAGNOSIS — Z96652 Presence of left artificial knee joint: Secondary | ICD-10-CM | POA: Diagnosis not present

## 2016-09-07 DIAGNOSIS — Z471 Aftercare following joint replacement surgery: Secondary | ICD-10-CM | POA: Diagnosis not present

## 2016-09-07 DIAGNOSIS — I10 Essential (primary) hypertension: Secondary | ICD-10-CM | POA: Diagnosis not present

## 2016-09-07 DIAGNOSIS — M1711 Unilateral primary osteoarthritis, right knee: Secondary | ICD-10-CM | POA: Diagnosis not present

## 2016-09-07 DIAGNOSIS — Z7982 Long term (current) use of aspirin: Secondary | ICD-10-CM | POA: Diagnosis not present

## 2016-09-07 DIAGNOSIS — E785 Hyperlipidemia, unspecified: Secondary | ICD-10-CM | POA: Diagnosis not present

## 2016-09-07 DIAGNOSIS — E669 Obesity, unspecified: Secondary | ICD-10-CM | POA: Diagnosis not present

## 2016-09-07 DIAGNOSIS — M150 Primary generalized (osteo)arthritis: Secondary | ICD-10-CM | POA: Diagnosis not present

## 2016-09-07 DIAGNOSIS — Z96652 Presence of left artificial knee joint: Secondary | ICD-10-CM | POA: Diagnosis not present

## 2016-09-08 ENCOUNTER — Ambulatory Visit (INDEPENDENT_AMBULATORY_CARE_PROVIDER_SITE_OTHER): Payer: Medicare HMO | Admitting: Orthopaedic Surgery

## 2016-09-08 DIAGNOSIS — Z96652 Presence of left artificial knee joint: Secondary | ICD-10-CM

## 2016-09-08 MED ORDER — OXYCODONE-ACETAMINOPHEN 5-325 MG PO TABS: 1.0000 | ORAL_TABLET | Freq: Three times a day (TID) | ORAL | 0 refills | Status: DC | PRN

## 2016-09-08 NOTE — Progress Notes (Signed)
The patient is doing exceptionally well status post a left total knee replacement. She's been participating in home therapy and is anxious to transition to outpatient therapy. She has no complains.  On exam her incisions healed nicely I removed the old Steri-Strips and placed knee Steri-Strips. Her calf is soft. She has almost full extension to about 90 of flexion. Notes from home therapy test the fact that she is doing well.  We'll have her transition outpatient therapy at this point. The work on aggressive range of motion, balance, strength training, gait training and coordination. We'll see her back in a month to see how she doing overall. At that point she still considering a right total knee replacement she will let us know because she is someone who is motivated we could probably proceed with surgery at some point on that knee. She'll stop her aspirin as well.

## 2016-09-20 ENCOUNTER — Ambulatory Visit: Payer: Medicare HMO | Attending: Orthopaedic Surgery

## 2016-09-20 DIAGNOSIS — Z96652 Presence of left artificial knee joint: Secondary | ICD-10-CM | POA: Insufficient documentation

## 2016-09-20 DIAGNOSIS — M6281 Muscle weakness (generalized): Secondary | ICD-10-CM

## 2016-09-20 DIAGNOSIS — M25662 Stiffness of left knee, not elsewhere classified: Secondary | ICD-10-CM | POA: Diagnosis present

## 2016-09-20 DIAGNOSIS — M25562 Pain in left knee: Secondary | ICD-10-CM | POA: Insufficient documentation

## 2016-09-20 DIAGNOSIS — R600 Localized edema: Secondary | ICD-10-CM | POA: Insufficient documentation

## 2016-09-20 NOTE — Therapy (Signed)
[name], [place], [address] Phone: [phone]   Fax:  [phone]  Physical Therapy Evaluation  Patient Details  Name: Sherri Delgado MRN: [id number] Date of Birth: 1946-01-13 Referring Provider: Jean Rosenthal, MD  Encounter Date: 09/20/2016      PT End of Session - 09/20/16 1454    Visit Number 1   Number of Visits 20   Date for PT Re-Evaluation 11/25/16   Authorization Type [employer] MCR     Authorization Time Period Kx at visit 15   PT Start Time 0215   PT Stop Time 0300   PT Time Calculation (min) 45 min   Activity Tolerance Patient tolerated treatment well;No increased pain   Behavior During Therapy WFL for tasks assessed/performed      Past Medical History:  Diagnosis Date  . Arthritis   . Depression    Hx of with divorce  . History of cervical cancer    s/p hysterectomyy. Most recent pap in 2009 normal  . Hyperlipidemia   . Hypertension   . Obesity   . Osteopenia    DEXA 08/2003, T score lumbar -1.9    Past Surgical History:  Procedure Laterality Date  . ABDOMINAL HYSTERECTOMY  1998   In [place] "baby maker gone, playpen still there"  . COLONOSCOPY  2013   normal   . EYE SURGERY  2005   cataract surgery  . TOTAL KNEE ARTHROPLASTY Left 08/26/2016   Procedure: LEFT TOTAL KNEE ARTHROPLASTY;  Surgeon: Mcarthur Rossetti, MD;  Location: [facility];  Service: Orthopedics;  Laterality: Left;    There were no vitals filed for this visit.       Subjective Assessment - 09/20/16 1423    Subjective She reports TKA 4 weeks this friday. She received HHPT for 4 visits.  She has HEP daily.. She report OA with bone on bone. She also report Bakers cyst. 15 year history of knee pain.    How long can you sit comfortably? 2 hours   How long can you stand comfortably? She has done dishes and home activity without breaks.    How long can you walk comfortably?  10 min or more   Patient Stated Goals  She wants to  do things I used to do and have normal activity.    Currently in Pain? Yes   Pain Score 3    Pain Orientation Left   Pain Descriptors / Indicators Aching   Pain Type Surgical pain   Pain Onset 1 to 4 weeks ago   Pain Frequency Constant   Aggravating Factors  Weather, stretching   Pain Relieving Factors meds,    Multiple Pain Sites No            [facility] PT Assessment - 09/20/16 0001      Assessment   Medical Diagnosis LT TKA   Referring Provider Jean Rosenthal, MD   Onset Date/Surgical Date 08/26/16   Next MD Visit 2 weeks   Prior Therapy HHPT     Precautions   Precautions None     Restrictions   Weight Bearing Restrictions No     Balance Screen   Has the patient fallen in the past 6 months No   Has the patient had a decrease in activity level because of a fear of falling?  No   Is the patient reluctant to leave their home because of a fear of falling?  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     Cognition   Overall Cognitive Status Within Functional Limits for tasks assessed     Observation/Other Assessments   Focus on Therapeutic Outcomes (FOTO)  1% limited     ROM / Strength   AROM / PROM / Strength AROM;PROM;Strength     AROM   AROM Assessment Site Knee   Right/Left Knee Right;Left   Right Knee Extension -10   Right Knee Flexion 85   Left Knee Extension -20   Left Knee Flexion 84     PROM   PROM Assessment Site Knee   Right/Left Knee Left   Left Knee Extension 0   Left Knee Flexion 87      Strength   Strength Assessment Site Knee   Right/Left Knee Right;Left   Right Knee Flexion 5/5   Right Knee Extension 5/5   Left Knee Flexion 4+/5   Left Knee Extension 4+/5     Palpation   Palpation comment decreased scar mobility distaly with some puckering     Ambulation/Gait   Gait Comments SPC decr weight  to LT leg trunk lean to left.   trunk lean RT                    [facility] Adult PT Treatment/Exercise - 09/20/16 0001      Self-Care   Self-Care RICE   RICE asked to ice and elevate more if ableand importance and hoew high to elevate     Exercises   Exercises Knee/Hip     Knee/Hip Exercises: Aerobic   Nustep L1 8 min LE only for ROM                PT Education - 09/20/16 1509    Education provided Yes   Education Details POC,,    Person(s) Educated Patient   Methods Explanation   Comprehension Verbalized understanding          PT Short Term Goals - 09/20/16 1435      PT SHORT TERM GOAL #1   Title She will be in independent with inital HEP   Time 4   Period Weeks   Status New     PT SHORT TERM GOAL #2   Title She will improve active Flexion to  100 degrees to asssit with mobility for home tasks   Time 4   Period Weeks   Status New     PT SHORT TERM GOAL #3   Title She will improve active extension to 0 degrees in to improve stability on feet with walkin   Time 4   Period Weeks   Status New     PT SHORT TERM GOAL #4   Title She will report no more than 2-3/10 pain and pain becomes intermittant   Time 4   Period Weeks   Status New     PT SHORT TERM GOAL #5   Title she will be able to walk 12-16 steps with 2 rail step ove rstep to access home safely  Time 4   Period Weeks   Status New           PT Long Term Goals - 2016/09/24 1504      PT LONG TERM GOAL #1   Title she will be independent with all HEP issued   Time 10   Period Weeks   Status New     PT LONG TERM GOAL #2   Title She will return to all normal home  tasks and work.    Time 10   Period Weeks   Status New     PT LONG TERM GOAL #3   Title she will improve quad and hamstring strength to 5/5 to assist walking without cane   Time 10   Period Weeks   Status New     PT LONG TERM GOAL #4   Title she willwalk withut cane in home and out for normal community activity including in store for shopping   Time 10   Period Weeks   Status New     PT LONG TERM GOAL #5   Title She will be able to walk 12-16 steps step ove r step with rail safely to access home and work stairs   Time 10   Period Weeks   Status New               Plan - 24-Sep-2016 1455    Clinical Impression Statement MS Forst presents for low complexity eval for TKA LT  with decrease strength, ROM ( RT knee also)  making walking decreased tolerance. Mild edema , full passive extension ,Limited flexion bilaterally   Rehab Potential Good   PT Frequency 2x / week   PT Duration --  10 weeks   PT Treatment/Interventions Cryotherapy;Vasopneumatic Device;Passive range of motion;Manual techniques;Therapeutic exercise;Patient/family education   PT Next Visit Plan measure edema knee, quad strength and flexion ROM. modalities as needed    PT Home Exercise Plan quad sets , elevation and ice,    Consulted and Agree with Plan of Care Patient      Patient will benefit from skilled therapeutic intervention in order to improve the following deficits and impairments:  Difficulty walking, Decreased range of motion, Pain, Increased edema, Decreased strength, Decreased activity tolerance, Decreased balance  Visit Diagnosis: History of total knee arthroplasty, left - Plan: PT plan of care cert/re-cert  Stiffness of left knee, not elsewhere classified - Plan: PT plan of care cert/re-cert  Muscle weakness (generalized) - Plan: PT plan of care cert/re-cert  Acute pain of left knee - Plan: PT plan of care cert/re-cert  Localized edema - Plan: PT plan of care cert/re-cert       G-Codes - 24-Sep-2016 1512    Functional Assessment Tool Used (Outpatient Only) clinical judgement   Functional Limitation Mobility: Walking and moving around   Mobility: Walking and Moving Around Current Status (F7494) At least 40 percent but less than 60 percent impaired, limited or restricted   Mobility: Walking and Moving Around Goal Status [id number]) At least 20 percent but less than 40 percent impaired, limited or restricted       Problem List Patient Active Problem List   Diagnosis Date Noted  . Status post total left knee replacement 08/26/2016  . Unilateral primary osteoarthritis, right knee 07/26/2016  . Unilateral primary osteoarthritis, left knee 07/26/2016  . PCB (post coital bleeding) 03/31/2016  . Breast pain, right 05/19/2015  . Routine general medical examination at a health care facility 11/04/2014  . OBESITY 07/16/2008  .  KNEE PAIN 07/16/2008  . HYPERGLYCEMIA, BORDERLINE 06/24/2006  . CERVICAL CANCER 05/10/2006  . Hyperlipidemia 05/10/2006  . HTN (hypertension) 05/10/2006  . Osteopenia 05/10/2006    Darrel Hoover  PT 09/20/2016, 3:15 PM  [facility] [address] [place], [place], [address] Phone: [phone]   Fax:  [phone]  Name: Annika Selke MRN: [id number] Date of Birth: August 10, 1945

## 2016-09-26 ENCOUNTER — Ambulatory Visit: Payer: Medicare HMO | Admitting: Physical Therapy

## 2016-09-26 ENCOUNTER — Encounter: Payer: Self-pay | Admitting: Physical Therapy

## 2016-09-26 DIAGNOSIS — M25662 Stiffness of left knee, not elsewhere classified: Secondary | ICD-10-CM

## 2016-09-26 DIAGNOSIS — Z96652 Presence of left artificial knee joint: Secondary | ICD-10-CM

## 2016-09-26 DIAGNOSIS — M6281 Muscle weakness (generalized): Secondary | ICD-10-CM

## 2016-09-26 DIAGNOSIS — M25562 Pain in left knee: Secondary | ICD-10-CM

## 2016-09-26 DIAGNOSIS — R600 Localized edema: Secondary | ICD-10-CM

## 2016-09-26 NOTE — Patient Instructions (Addendum)
From exercise drawer 5-10 X each: Knee stretch into flexion, sitting with toe taps,  Prone with strap 10 X 10 seconds 1-2 x a day Standing step stretch 10 x 10 seconds 2 x a day Knee strength standing , all issued 5-10 x daily Wall slides double, progress to single leg 10-30 x

## 2016-09-26 NOTE — Therapy (Addendum)
[place], [place], [address] Phone: [phone]   Fax:  [phone]  Physical Therapy Treatment/Discharge  Patient Details  Name: Sherri Delgado MRN: [id number] Date of Birth: 05/07/1946 Referring Provider: Jean Rosenthal, MD  Encounter Date: 09/26/2016      PT End of Session - 09/26/16 1542    Visit Number 2   Number of Visits 20   Date for PT Re-Evaluation 11/25/16   PT Start Time 1330   PT Stop Time 1415   PT Time Calculation (min) 45 min   Activity Tolerance Patient tolerated treatment well   Behavior During Therapy [facility] for tasks assessed/performed      Past Medical History:  Diagnosis Date  . Arthritis   . Depression    Hx of with divorce  . History of cervical cancer    s/p hysterectomyy. Most recent pap in 2009 normal  . Hyperlipidemia   . Hypertension   . Obesity   . Osteopenia    DEXA 08/2003, T score lumbar -1.9    Past Surgical History:  Procedure Laterality Date  . ABDOMINAL HYSTERECTOMY  1998   In [place] "baby maker gone, playpen still there"  . COLONOSCOPY  2013   normal   . EYE SURGERY  2005   cataract surgery  . TOTAL KNEE ARTHROPLASTY Left 08/26/2016   Procedure: LEFT TOTAL KNEE ARTHROPLASTY;  Surgeon: Mcarthur Rossetti, MD;  Location: [facility];  Service: Orthopedics;  Laterality: Left;    There were no vitals filed for this visit.      Subjective Assessment - 09/26/16 1529    Subjective This is my last session,  I cannot afford the co-pay.     Currently in Pain? Yes   Pain Score 0-No pain  mild pain intermitantly   Pain Location Knee   Pain Orientation Left   Pain Type Surgical pain   Pain Frequency Constant   Aggravating Factors  weather, stretching   Pain Relieving Factors meds            [facility] PT Assessment - 09/26/16 0001      AROM   Left Knee Extension 0   Left Knee Flexion 90                     [facility] Adult PT  Treatment/Exercise - 09/26/16 0001      Self-Care   Self-Care --  [facility] Info     Knee/Hip Exercises: Public affairs consultant 10 seconds   Sports administrator Limitations standing step stretch 10 X 10 seconds,  Also prone stretch with Strap HEP both     Knee/Hip Exercises: Standing   Lateral Step Up 5 reps;Hand Hold: 2;Step Height: 6"   Lateral Step Up Limitations Cues,  HEP,  difficult   Forward Step Up Left;1 set;10 reps;Step Height: 2";Step Height: 4";Step Height: 6"   Forward Step Up Limitations HEP   Step Down Limitations demonstrated progression for steps for patient   Functional Squat Limitations HEP 3 X    Wall Squat Limitations Reviewed only  HEP   Other Standing Knee Exercises Forward facing wall slides,  pre gait 10 X 2 sets double and single.     Knee/Hip Exercises: Seated   Heel Slides 5 reps   Heel Slides Limitations with toe taps   Sit to [place] --  Already does for HEP     Knee/Hip Exercises: Supine   Patellar Mobs Checked, non tight  Manual Therapy   Manual Therapy Joint mobilization   Manual therapy comments Scar tissue mobs, how to do   Joint Mobilization Strap mobilization with movement with distraction and PA glides tibia,  This feels good                PT Education - 09/26/16 1542    Education provided Yes   Education Details HEP   Person(s) Educated Patient   Methods Explanation;Demonstration;Verbal cues;Handout;Tactile cues   Comprehension Verbalized understanding;Returned demonstration          PT Short Term Goals - 09/26/16 1802      PT SHORT TERM GOAL #1   Title She will be in independent with inital HEP   Time 4   Period Weeks   Status Achieved     PT SHORT TERM GOAL #2   Title She will improve active Flexion to  100 degrees to asssit with mobility for home tasks   Baseline 90   Time 4   Period Weeks   Status Not Met     PT SHORT TERM GOAL #3   Title She will improve active extension to 0 degrees in to improve  stability on feet with walkin   Baseline 0   Time 4   Period Weeks   Status Achieved     PT SHORT TERM GOAL #4   Title She will report no more than 2-3/10 pain and pain becomes intermittant   Baseline pain intermitant.  Mild pain  usually more with stretching   Time 4   Period Weeks   Status Partially Met     PT SHORT TERM GOAL #5   Title she will be able to walk 12-16 steps with 2 rail step ove rstep to access home safely   Time 4   Period Weeks   Status Unable to assess           PT Long Term Goals - 09/26/16 1803      PT LONG TERM GOAL #1   Title she will be independent with all HEP issued   Baseline needs exercise sheets for reference   Time 10   Period Weeks   Status Partially Met     PT LONG TERM GOAL #2   Title She will return to all normal home tasks and work.    Baseline limited at home,  not yet back to work   Time 10   Period Weeks   Status Not Met     PT LONG TERM GOAL #3   Title she will improve quad and hamstring strength to 5/5 to assist walking without cane   Time 10   Period Weeks   Status Unable to assess     PT LONG TERM GOAL #4   Title she willwalk withut cane in home and out for normal community activity including in store for shopping   Baseline needs cane intermitantly   Time 10   Period Weeks   Status Not Met     PT LONG TERM GOAL #5   Title She will be able to walk 12-16 steps step ove r step with rail safely to access home and work stairs   Time 10   Period Weeks   Status Unable to assess               Plan - 09/26/16 1542    Clinical Impression Statement Ms Iwai wants this to be her last visit due not being able to affort her  co-pay.  Exercises and their progression issued for home.  [facility] information also issued is patient also decides she can go to [place] to the free clinic.  No pain at end of session 90 AROM left knee.  She plans to return to part time work next week.    PT Next Visit Plan Discharge to  HEP at patient's request.   PT Home Exercise Plan quad sets , elevation and ice, knee stretches,   strengthening   Recommended Other Services [facility] and Agree with Plan of Care Patient      Patient will benefit from skilled therapeutic intervention in order to improve the following deficits and impairments:  Difficulty walking, Decreased range of motion, Pain, Increased edema, Decreased strength, Decreased activity tolerance, Decreased balance  Visit Diagnosis: History of total knee arthroplasty, left  Stiffness of left knee, not elsewhere classified  Muscle weakness (generalized)  Acute pain of left knee  Localized edema     Problem List Patient Active Problem List   Diagnosis Date Noted  . Status post total left knee replacement 08/26/2016  . Unilateral primary osteoarthritis, right knee 07/26/2016  . Unilateral primary osteoarthritis, left knee 07/26/2016  . PCB (post coital bleeding) 03/31/2016  . Breast pain, right 05/19/2015  . Routine general medical examination at a health care facility 11/04/2014  . OBESITY 07/16/2008  . KNEE PAIN 07/16/2008  . HYPERGLYCEMIA, BORDERLINE 06/24/2006  . CERVICAL CANCER 05/10/2006  . Hyperlipidemia 05/10/2006  . HTN (hypertension) 05/10/2006  . Osteopenia 05/10/2006    Kesia Dalto PTA 09/26/2016, 6:06 PM  [facility] [address] [place], [place], [address] Phone: [phone]   Fax:  [phone]  Name: Maryan Sivak MRN: [id number] Date of Birth: 10-02-1945  PHYSICAL THERAPY DISCHARGE SUMMARY  Visits from Start of Care: 2  Current functional level related to goals / functional outcomes: See above   Remaining deficits: See above   Education / Equipment: HEP Plan: Patient agrees to discharge.  Patient goals were partially met. Patient is being discharged due to financial reasons.  ?????    Lillette Boxer Chasse  PT  09/29/16      7:07 AM

## 2016-09-28 ENCOUNTER — Ambulatory Visit: Payer: Medicare HMO | Admitting: Physical Therapy

## 2016-10-03 ENCOUNTER — Ambulatory Visit: Payer: Medicare HMO

## 2016-10-05 ENCOUNTER — Ambulatory Visit: Payer: Medicare HMO | Admitting: Physical Therapy

## 2016-10-10 ENCOUNTER — Encounter (INDEPENDENT_AMBULATORY_CARE_PROVIDER_SITE_OTHER): Payer: Self-pay

## 2016-10-10 ENCOUNTER — Ambulatory Visit (INDEPENDENT_AMBULATORY_CARE_PROVIDER_SITE_OTHER): Payer: Medicare HMO | Admitting: Orthopaedic Surgery

## 2016-10-10 DIAGNOSIS — Z96652 Presence of left artificial knee joint: Secondary | ICD-10-CM

## 2016-10-10 NOTE — Progress Notes (Signed)
The patient is now 6 weeks status post a left total knee replacement. 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 She's been happy with how her knee is doing. She is going to a gym for therapy right now.  On examination her left knee shows no swelling. She has full extension to about 95 of flexion but she said that's fine for her right now. She also still give it time to increase her motion. We went over stretching exercises to try.  She does have an arthritic right knee was consider knee replacement on the right knee at in the year. We'll see her back in a month to see how she is doing overall but no x-rays are needed. I did give her a note to allow her return to work tomorrow.

## 2016-10-20 DIAGNOSIS — Z0100 Encounter for examination of eyes and vision without abnormal findings: Secondary | ICD-10-CM | POA: Diagnosis not present

## 2016-10-20 DIAGNOSIS — H52223 Regular astigmatism, bilateral: Secondary | ICD-10-CM | POA: Diagnosis not present

## 2016-11-02 ENCOUNTER — Other Ambulatory Visit: Payer: Self-pay | Admitting: Internal Medicine

## 2016-11-02 ENCOUNTER — Ambulatory Visit (INDEPENDENT_AMBULATORY_CARE_PROVIDER_SITE_OTHER): Payer: Medicare HMO | Admitting: Internal Medicine

## 2016-11-02 ENCOUNTER — Encounter: Payer: Self-pay | Admitting: Internal Medicine

## 2016-11-02 VITALS — BP 135/77 | HR 72 | Temp 98.2°F | Wt 202.7 lb

## 2016-11-02 DIAGNOSIS — Z0000 Encounter for general adult medical examination without abnormal findings: Secondary | ICD-10-CM

## 2016-11-02 DIAGNOSIS — Z1231 Encounter for screening mammogram for malignant neoplasm of breast: Secondary | ICD-10-CM

## 2016-11-02 DIAGNOSIS — D649 Anemia, unspecified: Secondary | ICD-10-CM | POA: Insufficient documentation

## 2016-11-02 DIAGNOSIS — Z79899 Other long term (current) drug therapy: Secondary | ICD-10-CM

## 2016-11-02 DIAGNOSIS — E785 Hyperlipidemia, unspecified: Secondary | ICD-10-CM

## 2016-11-02 DIAGNOSIS — G479 Sleep disorder, unspecified: Secondary | ICD-10-CM | POA: Diagnosis not present

## 2016-11-02 DIAGNOSIS — I10 Essential (primary) hypertension: Secondary | ICD-10-CM | POA: Diagnosis not present

## 2016-11-02 DIAGNOSIS — M8580 Other specified disorders of bone density and structure, unspecified site: Secondary | ICD-10-CM | POA: Diagnosis not present

## 2016-11-02 MED ORDER — DIPHENHYDRAMINE HCL (SLEEP) 25 MG PO TABS: 75.0000 mg | ORAL_TABLET | Freq: Every evening | ORAL | 4 refills | Status: DC | PRN

## 2016-11-02 MED ORDER — LISINOPRIL 40 MG PO TABS: 40.0000 mg | ORAL_TABLET | Freq: Every day | ORAL | 4 refills | Status: DC

## 2016-11-02 MED ORDER — AMLODIPINE BESYLATE 10 MG PO TABS: 10.0000 mg | ORAL_TABLET | Freq: Every day | ORAL | 4 refills | Status: DC

## 2016-11-02 MED ORDER — HYDROCHLOROTHIAZIDE 25 MG PO TABS: 25.0000 mg | ORAL_TABLET | Freq: Every day | ORAL | 4 refills | Status: DC

## 2016-11-02 NOTE — Assessment & Plan Note (Addendum)
Discussed elevated LDL noted on labs in 2016. She does not have a hx of heart disease. At that time she wanted to do lifestyle modifications. Since her left knee replacement in February she has been going to [facility] and would like to continue lifestyle modifications w/ exercise and diet at this time. F/u in 6 months for lipid panel.

## 2016-11-02 NOTE — Progress Notes (Signed)
   CC:htn  HPI:  [name] is a 71 y.o. with PMHx as outlined below who presents to clinic for htn follow up. Please see problem list for further details of patient's chronic medical issues.   Past Medical History:  Diagnosis Date  . Arthritis   . Depression    Hx of with divorce  . History of cervical cancer    s/p hysterectomyy. Most recent pap in 2009 normal  . Hyperlipidemia   . Hypertension   . Obesity   . Osteopenia    DEXA 08/2003, T score lumbar -1.9    Review of Systems: Denies chest pain, n/v, abd pain, hematuria, hematochezia, melena, difficulty walking, confusion, skin color changes, and dehydration. She is eating well, has some fatigue.   Physical Exam:  Vitals:   11/02/16 0922  BP: 135/77  Pulse: 72  Temp: 98.2 F (36.8 C)  TempSrc: Oral  SpO2: 100%  Weight: 202 lb 11.2 oz (91.9 kg)   General: NAD, wel nourished, pleasant Lungs: CTAB, no wheezing or crackles Cardiac: RRR, no murmurs/g/r GI: soft, active bowel sounds, non tttp Neuro: CN II-XII grossly intact Skin: warm and dry, midline vertical surgical site scar over left knee Ext: trace pedal edema b/l   Assessment & Plan:   See Encounters Tab for problem based charting.  Patient discussed with Dr. Dareen Piano

## 2016-11-02 NOTE — Assessment & Plan Note (Addendum)
Refilled sominex 75mg  that patient takes qhs. 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 She has been on this chronically without any adverse side effects such as confusion, dehydration, or dry mouth.

## 2016-11-02 NOTE — Progress Notes (Signed)
Internal Medicine Clinic Attending  Case discussed with Dr. Truong at the time of the visit.  We reviewed the resident's history and exam and pertinent patient test results.  I agree with the assessment, diagnosis, and plan of care documented in the resident's note.  

## 2016-11-02 NOTE — Assessment & Plan Note (Addendum)
A: BP well controlled. BMET in 08/2016 wnl  P: Refilled norvasc 10mg , lisinopril 40mg , and HCTZ 25mg . F/u in 6 months.

## 2016-11-02 NOTE — Assessment & Plan Note (Signed)
Ordered a mammogram and DEXA.

## 2016-11-02 NOTE — Assessment & Plan Note (Signed)
Hx of osteopenia in 2011, repeat DEXA scan ordered

## 2016-11-02 NOTE — Assessment & Plan Note (Signed)
Hgb 10 down from her baseline of 12-13. She has some fatigue but denies blood in her stools or urine, denies skin color changes or pica. Will get CBC today to f/u on hgb.

## 2016-11-03 LAB — CBC
HEMATOCRIT: 35.4 % (ref 34.0–46.6)
Hemoglobin: 12.1 g/dL (ref 11.1–15.9)
MCH: 28.5 pg (ref 26.6–33.0)
MCHC: 34.2 g/dL (ref 31.5–35.7)
MCV: 84 fL (ref 79–97)
Platelets: 251 10*3/uL (ref 150–379)
RBC: 4.24 x10E6/uL (ref 3.77–5.28)
RDW: 14.1 % (ref 12.3–15.4)
WBC: 5.6 10*3/uL (ref 3.4–10.8)

## 2016-11-08 ENCOUNTER — Ambulatory Visit (INDEPENDENT_AMBULATORY_CARE_PROVIDER_SITE_OTHER): Payer: Medicare HMO | Admitting: Orthopaedic Surgery

## 2016-11-08 ENCOUNTER — Encounter (INDEPENDENT_AMBULATORY_CARE_PROVIDER_SITE_OTHER): Payer: Self-pay | Admitting: Orthopaedic Surgery

## 2016-11-08 DIAGNOSIS — Z96652 Presence of left artificial knee joint: Secondary | ICD-10-CM

## 2016-11-08 DIAGNOSIS — M1711 Unilateral primary osteoarthritis, right knee: Secondary | ICD-10-CM

## 2016-11-08 NOTE — Progress Notes (Signed)
The patient is now 9 weeks status post a left total knee arthroplasty. She is doing well with no problems she states at all. Her right knee has severe arthritis and she was consider knee replacement on the right knee but in the year.  On examination of her left knee are swelling is minimal. Her extension is full. I can only flex to 95 but she is comfortable with this. Her right knee shows severe arthritic changes with a significant varus malalignment.  She like to consider a knee replacement on the right knee in December at the end of the month. I'll see her back in about October so we can repeat evaluation and get her set up for the December surgery. All questions were encouraged and answered. I was that she is having problems before then she knows to come see [place].

## 2016-11-17 ENCOUNTER — Ambulatory Visit: Payer: Medicare HMO

## 2016-12-13 ENCOUNTER — Encounter: Payer: Self-pay | Admitting: *Deleted

## 2017-02-10 ENCOUNTER — Other Ambulatory Visit: Payer: Self-pay | Admitting: Internal Medicine

## 2017-02-14 ENCOUNTER — Ambulatory Visit: Payer: Medicare HMO

## 2017-04-11 ENCOUNTER — Ambulatory Visit (INDEPENDENT_AMBULATORY_CARE_PROVIDER_SITE_OTHER): Payer: Medicare HMO

## 2017-04-11 ENCOUNTER — Ambulatory Visit (INDEPENDENT_AMBULATORY_CARE_PROVIDER_SITE_OTHER): Payer: Medicare HMO | Admitting: Physician Assistant

## 2017-04-11 DIAGNOSIS — M1711 Unilateral primary osteoarthritis, right knee: Secondary | ICD-10-CM

## 2017-04-11 DIAGNOSIS — Z96652 Presence of left artificial knee joint: Secondary | ICD-10-CM | POA: Diagnosis not present

## 2017-04-11 NOTE — Progress Notes (Signed)
Office Visit Note   Patient: Sherri Delgado           Date of Birth: 06-Oct-1945           MRN: [id number] Visit Date: 04/11/2017              Requested by: Norman Herrlich, MD No address on file PCP: Melanee Spry, MD   Assessment & Plan: Visit Diagnoses:  1. Primary osteoarthritis of right knee   2. Status post total left knee replacement     Plan: Due to fact the patient has severe pain and end-stage osteoarthritis of right knee which affects her quality of life, we'll proceed with a right total knee arthroplasty in the near future. Questions were encouraged and answered. She had no complications with the left total knee arthroplasty which was performed 7 months ago. No change in her overall medical health or status.  Follow-Up Instructions: Return for 2 weeks postop.   Orders:  Orders Placed This Encounter  Procedures  . XR Knee 1-2 Views Right   No orders of the defined types were placed in this encounter.     Procedures: No procedures performed   Clinical Data: No additional findings.   Subjective: Right knee pain  HPI Sherri Delgado returns today for follow-up of her left total knee which is performed 7 months ago. She states left knee is doing very well. She is very happy with the results. She has no pain in the knee. She's having pain in her right knee mostly medial compartment. She would like to undergo right total knee replacement near future.  Review of Systems No chest pain shortness breath fevers or chills.  Objective: Vital Signs: LMP 02/23/1997   Physical Exam  Constitutional: She is oriented to person, place, and time. She appears well-developed and well-nourished. No distress.  Pulmonary/Chest: Effort normal.  Neurological: She is alert and oriented to person, place, and time.  Skin: Skin is warm and dry. She is not diaphoretic.  Psychiatric: She has a normal mood and affect.    Ortho Exam Left knee full extension and flexion without  pain. No instability with valgus varus stressing. Surgical incision is well-healed. There is no abnormal warmth erythema or effusion. Right knee with varus deformity tenderness along medial joint line. Full extension flexion to 100. No instability valgus varus stressing. Specialty Comments:  No specialty comments available.  Imaging: Xr Knee 1-2 Views Right  Result Date: 04/11/2017 Right knee AP lateral views: No acute fracture. Bone-on-bone medial compartment. Severe patellofemoral changes. Lateral compartments well preserved.    PMFS History: Patient Active Problem List   Diagnosis Date Noted  . Anemia 11/02/2016  . Sleep disturbance 11/02/2016  . Status post total left knee replacement 08/26/2016  . Unilateral primary osteoarthritis, right knee 07/26/2016  . Unilateral primary osteoarthritis, left knee 07/26/2016  . Preventative health care 11/04/2014  . OBESITY 07/16/2008  . CERVICAL CANCER 05/10/2006  . Hyperlipidemia 05/10/2006  . HTN (hypertension) 05/10/2006  . Osteopenia 05/10/2006   Past Medical History:  Diagnosis Date  . Arthritis   . Depression    Hx of with divorce  . History of cervical cancer    s/p hysterectomyy. Most recent pap in 2009 normal  . Hyperlipidemia   . Hypertension   . Obesity   . Osteopenia    DEXA 08/2003, T score lumbar -1.9    Family History  Problem Relation Age of Onset  . Cancer Mother  died from brain tumor  . Cancer Father        died from liver cancer and cirrhosis  . Diabetes Brother   . Aneurysm Sister        died from brain aneurysm bleed  . Colon cancer Neg Hx   . Stomach cancer Neg Hx   . Rectal cancer Neg Hx   . Colon polyps Neg Hx     Past Surgical History:  Procedure Laterality Date  . ABDOMINAL HYSTERECTOMY  1998   In [place] "baby maker gone, playpen still there"  . COLONOSCOPY  2013   normal   . EYE SURGERY  2005   cataract surgery  . TOTAL KNEE ARTHROPLASTY Left 08/26/2016   Procedure: LEFT TOTAL KNEE  ARTHROPLASTY;  Surgeon: Mcarthur Rossetti, MD;  Location: [facility];  Service: Orthopedics;  Laterality: Left;   Social History   Occupational History  . Not on file.   Social History Main Topics  . Smoking status: Never Smoker  . Smokeless tobacco: Never Used  . Alcohol use 0.6 oz/week    1 Glasses of wine per week     Comment: Occasional  . Drug use: Yes    Types: Marijuana     Comment: hx of for knee pain  . Sexual activity: Yes

## 2017-06-06 ENCOUNTER — Telehealth (INDEPENDENT_AMBULATORY_CARE_PROVIDER_SITE_OTHER): Payer: Self-pay | Admitting: Orthopaedic Surgery

## 2017-06-06 NOTE — Telephone Encounter (Signed)
Patient said information regarding her surgery at the end of December was supposed to be sent to her and she hasnt received anything. Can you resend it or advise patient? CB # [phone]

## 2017-06-09 NOTE — Telephone Encounter (Signed)
I called patient and confirmed surgery date of 06/30/17.

## 2017-06-21 ENCOUNTER — Other Ambulatory Visit (INDEPENDENT_AMBULATORY_CARE_PROVIDER_SITE_OTHER): Payer: Self-pay | Admitting: Orthopaedic Surgery

## 2017-06-21 DIAGNOSIS — M1711 Unilateral primary osteoarthritis, right knee: Secondary | ICD-10-CM

## 2017-06-21 NOTE — Progress Notes (Signed)
EKG 08-16-16 EPIC

## 2017-06-21 NOTE — Patient Instructions (Addendum)
Sherri Delgado  06/21/2017   Your procedure is scheduled on: 06-30-17  Report to [facility] Main  Entrance   Report to admitting at      1200 PM   Call this number if you have problems the morning of surgery [phone]   Remember: ONLY 1 PERSON MAY GO WITH YOU TO SHORT STAY TO GET  READY MORNING OF [place].   Do not eat food  :After Midnight.  YOU MAY HAVE CLEAR LIQUIDS UNTIL 0830 AM THEN NOTHING BY MOUTH     Take these medicines the morning of surgery with A SIP OF WATER: amlodipine, atorvastatin                                You may not have any metal on your body including hair pins and              piercings  Do not wear jewelry, make-up, lotions, powders or perfumes, deodorant             Do not wear nail polish.  Do not shave  48 hours prior to surgery.      Do not bring valuables to the hospital. [place].  Contacts, dentures or bridgework may not be worn into surgery.  Leave suitcase in the car. After surgery it may be brought to your room.                 Please read over the following fact sheets you were given: _____________________________________________________________________          [facility] - Preparing for Surgery Before surgery, you can play an important role.  Because skin is not sterile, your skin needs to be as free of germs as possible.  You can reduce the number of germs on your skin by washing with CHG (chlorahexidine gluconate) soap before surgery.  CHG is an antiseptic cleaner which kills germs and bonds with the skin to continue killing germs even after washing. Please DO NOT use if you have an allergy to CHG or antibacterial soaps.  If your skin becomes reddened/irritated stop using the CHG and inform your nurse when you arrive at Short Stay. Do not shave (including legs and underarms) for at least 48 hours prior to the first CHG shower.  You may shave your  face/neck. Please follow these instructions carefully:  1.  Shower with CHG Soap the night before surgery and the  morning of Surgery.  2.  If you choose to wash your hair, wash your hair first as usual with your  normal  shampoo.  3.  After you shampoo, rinse your hair and body thoroughly to remove the  shampoo.                           4.  Use CHG as you would any other liquid soap.  You can apply chg directly  to the skin and wash                       Gently with a scrungie or clean washcloth.  5.  Apply the CHG Soap to your body ONLY FROM THE NECK DOWN.  Do not use on face/ open                           Wound or open sores. Avoid contact with eyes, ears mouth and genitals (private parts).                       Wash face,  Genitals (private parts) with your normal soap.             6.  Wash thoroughly, paying special attention to the area where your surgery  will be performed.  7.  Thoroughly rinse your body with warm water from the neck down.  8.  DO NOT shower/wash with your normal soap after using and rinsing off  the CHG Soap.                9.  Pat yourself dry with a clean towel.            10.  Wear clean pajamas.            11.  Place clean sheets on your bed the night of your first shower and do not  sleep with pets. Day of Surgery : Do not apply any lotions/deodorants the morning of surgery.  Please wear clean clothes to the hospital/surgery center.  FAILURE TO FOLLOW THESE INSTRUCTIONS MAY RESULT IN THE CANCELLATION OF YOUR SURGERY PATIENT SIGNATURE_________________________________  NURSE SIGNATURE__________________________________  ________________________________________________________________________    CLEAR LIQUID DIET   Foods Allowed                                                                     Foods Excluded  Coffee and tea, regular and decaf                             liquids that you cannot  Plain Jell-O in any flavor                                              see through such as: Fruit ices (not with fruit pulp)                                     milk, soups, orange juice  Iced Popsicles                                    All solid food Carbonated beverages, regular and diet                                    Cranberry, grape and apple juices Sports drinks like Gatorade Lightly seasoned clear broth or consume(fat free) Sugar, honey syrup  Sample Menu Breakfast  Lunch                                     Supper Cranberry juice                    Beef broth                            Chicken broth Jell-O                                     Grape juice                           Apple juice Coffee or tea                        Jell-O                                      Popsicle                                                Coffee or tea                        Coffee or tea  _____________________________________________________________________    Incentive Spirometer  An incentive spirometer is a tool that can help keep your lungs clear and active. This tool measures how well you are filling your lungs with each breath. Taking long deep breaths may help reverse or decrease the chance of developing breathing (pulmonary) problems (especially infection) following:  A long period of time when you are unable to move or be active. BEFORE THE PROCEDURE   If the spirometer includes an indicator to show your best effort, your nurse or respiratory therapist will set it to a desired goal.  If possible, sit up straight or lean slightly forward. Try not to slouch.  Hold the incentive spirometer in an upright position. INSTRUCTIONS FOR USE  1. Sit on the edge of your bed if possible, or sit up as far as you can in bed or on a chair. 2. Hold the incentive spirometer in an upright position. 3. Breathe out normally. 4. Place the mouthpiece in your mouth and seal your lips tightly around it. 5. Breathe in slowly  and as deeply as possible, raising the piston or the ball toward the top of the column. 6. Hold your breath for 3-5 seconds or for as long as possible. Allow the piston or ball to fall to the bottom of the column. 7. Remove the mouthpiece from your mouth and breathe out normally. 8. Rest for a few seconds and repeat Steps 1 through 7 at least 10 times every 1-2 hours when you are awake. Take your time and take a few normal breaths between deep breaths. 9. The spirometer may include an indicator to show your best effort. Use the indicator as a goal to work toward during each repetition. 10. After each set of 10 deep breaths,  practice coughing to be sure your lungs are clear. If you have an incision (the cut made at the time of surgery), support your incision when coughing by placing a pillow or rolled up towels firmly against it. Once you are able to get out of bed, walk around indoors and cough well. You may stop using the incentive spirometer when instructed by your caregiver.  RISKS AND COMPLICATIONS  Take your time so you do not get dizzy or light-headed.  If you are in pain, you may need to take or ask for pain medication before doing incentive spirometry. It is harder to take a deep breath if you are having pain. AFTER USE  Rest and breathe slowly and easily.  It can be helpful to keep track of a log of your progress. Your caregiver can provide you with a simple table to help with this. If you are using the spirometer at home, follow these instructions: [place] IF:   You are having difficultly using the spirometer.  You have trouble using the spirometer as often as instructed.  Your pain medication is not giving enough relief while using the spirometer.  You develop fever of 100.5 F (38.1 C) or higher. SEEK IMMEDIATE MEDICAL CARE IF:   You cough up bloody sputum that had not been present before.  You develop fever of 102 F (38.9 C) or greater.  You develop  worsening pain at or near the incision site. MAKE SURE YOU:   Understand these instructions.  Will watch your condition.  Will get help right away if you are not doing well or get worse. Document Released: 10/31/2006 Document Revised: 09/12/2011 Document Reviewed: 01/01/2007 [facility] Patient Information 2014 [place], [place].   ________________________________________________________________________

## 2017-06-21 NOTE — Progress Notes (Signed)
Please place orders in epic.pt. Has preop on 12-20  @ 1000! Thank you very much

## 2017-06-22 ENCOUNTER — Other Ambulatory Visit: Payer: Self-pay

## 2017-06-22 ENCOUNTER — Encounter (HOSPITAL_COMMUNITY): Payer: Self-pay

## 2017-06-22 ENCOUNTER — Encounter (HOSPITAL_COMMUNITY)
Admission: RE | Admit: 2017-06-22 | Discharge: 2017-06-22 | Disposition: A | Discharge status: Home / Self Care | Payer: Medicare HMO | Source: Ambulatory Visit | Attending: Orthopaedic Surgery | Admitting: Orthopaedic Surgery

## 2017-06-22 DIAGNOSIS — M1711 Unilateral primary osteoarthritis, right knee: Secondary | ICD-10-CM | POA: Insufficient documentation

## 2017-06-22 DIAGNOSIS — Z01812 Encounter for preprocedural laboratory examination: Secondary | ICD-10-CM | POA: Diagnosis present

## 2017-06-22 HISTORY — DX: Malignant (primary) neoplasm, unspecified: C80.1

## 2017-06-22 LAB — BASIC METABOLIC PANEL
Anion gap: 5 (ref 5–15)
BUN: 11 mg/dL (ref 6–20)
CO2: 30 mmol/L (ref 22–32)
Calcium: 9.6 mg/dL (ref 8.9–10.3)
Chloride: 102 mmol/L (ref 101–111)
Creatinine, Ser: 0.63 mg/dL (ref 0.44–1.00)
GFR calc Af Amer: >60 mL/min (ref >60)
GLUCOSE: 104 mg/dL — AB (ref 65–99)
POTASSIUM: 3.8 mmol/L (ref 3.5–5.1)
Sodium: 137 mmol/L (ref 135–145)

## 2017-06-22 LAB — CBC
HEMATOCRIT: 37.5 % (ref 36.0–46.0)
Hemoglobin: 13.4 g/dL (ref 12.0–15.0)
MCH: 29.3 pg (ref 26.0–34.0)
MCHC: 35.7 g/dL (ref 30.0–36.0)
MCV: 81.9 fL (ref 78.0–100.0)
Platelets: 223 10*3/uL (ref 150–400)
RBC: 4.58 MIL/uL (ref 3.87–5.11)
RDW: 13.5 % (ref 11.5–15.5)
WBC: 5.6 10*3/uL (ref 4.0–10.5)

## 2017-06-22 LAB — SURGICAL PCR SCREEN
MRSA, PCR: NEGATIVE
STAPHYLOCOCCUS AUREUS: NEGATIVE

## 2017-06-23 ENCOUNTER — Other Ambulatory Visit (INDEPENDENT_AMBULATORY_CARE_PROVIDER_SITE_OTHER): Payer: Self-pay

## 2017-06-30 ENCOUNTER — Inpatient Hospital Stay (HOSPITAL_COMMUNITY)
Admission: RE | Admit: 2017-06-30 | Discharge: 2017-07-02 | DRG: 470 | Disposition: A | Discharge status: Home Health | Payer: Medicare HMO | Source: Ambulatory Visit | Attending: Orthopaedic Surgery | Admitting: Orthopaedic Surgery

## 2017-06-30 ENCOUNTER — Encounter (HOSPITAL_COMMUNITY): Payer: Self-pay | Admitting: *Deleted

## 2017-06-30 ENCOUNTER — Other Ambulatory Visit: Payer: Self-pay

## 2017-06-30 ENCOUNTER — Encounter (HOSPITAL_COMMUNITY)
Admission: RE | Disposition: A | Discharge status: Home Health | Payer: Self-pay | Source: Ambulatory Visit | Attending: Orthopaedic Surgery

## 2017-06-30 ENCOUNTER — Inpatient Hospital Stay (HOSPITAL_COMMUNITY): Payer: Medicare HMO | Admitting: Certified Registered Nurse Anesthetist

## 2017-06-30 ENCOUNTER — Inpatient Hospital Stay (HOSPITAL_COMMUNITY): Payer: Medicare HMO

## 2017-06-30 DIAGNOSIS — Z8541 Personal history of malignant neoplasm of cervix uteri: Secondary | ICD-10-CM | POA: Diagnosis not present

## 2017-06-30 DIAGNOSIS — Z800 Family history of malignant neoplasm of digestive organs: Secondary | ICD-10-CM

## 2017-06-30 DIAGNOSIS — I10 Essential (primary) hypertension: Secondary | ICD-10-CM | POA: Diagnosis not present

## 2017-06-30 DIAGNOSIS — Z96652 Presence of left artificial knee joint: Secondary | ICD-10-CM | POA: Diagnosis not present

## 2017-06-30 DIAGNOSIS — Z833 Family history of diabetes mellitus: Secondary | ICD-10-CM | POA: Diagnosis not present

## 2017-06-30 DIAGNOSIS — G8918 Other acute postprocedural pain: Secondary | ICD-10-CM | POA: Diagnosis not present

## 2017-06-30 DIAGNOSIS — M1711 Unilateral primary osteoarthritis, right knee: Secondary | ICD-10-CM | POA: Diagnosis not present

## 2017-06-30 DIAGNOSIS — E785 Hyperlipidemia, unspecified: Secondary | ICD-10-CM | POA: Diagnosis present

## 2017-06-30 DIAGNOSIS — Z96651 Presence of right artificial knee joint: Secondary | ICD-10-CM

## 2017-06-30 DIAGNOSIS — Z471 Aftercare following joint replacement surgery: Secondary | ICD-10-CM | POA: Diagnosis not present

## 2017-06-30 HISTORY — PX: TOTAL KNEE ARTHROPLASTY: SHX125

## 2017-06-30 HISTORY — DX: Presence of right artificial knee joint: Z96.651

## 2017-06-30 SURGERY — ARTHROPLASTY, KNEE, TOTAL
Anesthesia: Spinal | Site: Knee | Laterality: Right

## 2017-06-30 MED ORDER — PROPOFOL 10 MG/ML IV BOLUS
INTRAVENOUS | Status: AC
  Filled 2017-06-30: qty 20

## 2017-06-30 MED ORDER — ROPIVACAINE HCL 7.5 MG/ML IJ SOLN
INTRAMUSCULAR | Status: DC | PRN
  Administered 2017-06-30: 20 mL via PERINEURAL

## 2017-06-30 MED ORDER — FENTANYL CITRATE (PF) 100 MCG/2ML IJ SOLN
INTRAMUSCULAR | Status: AC
  Administered 2017-06-30: 100 ug via INTRAVENOUS
  Filled 2017-06-30: qty 2

## 2017-06-30 MED ORDER — MIDAZOLAM HCL 2 MG/2ML IJ SOLN
1.0000 mg | INTRAMUSCULAR | Status: DC
  Administered 2017-06-30: 1 mg via INTRAVENOUS

## 2017-06-30 MED ORDER — HYDROCHLOROTHIAZIDE 25 MG PO TABS
25.0000 mg | ORAL_TABLET | Freq: Every day | ORAL | Status: DC
  Administered 2017-07-02: 25 mg via ORAL
  Filled 2017-06-30 (×3): qty 1

## 2017-06-30 MED ORDER — LACTATED RINGERS IV SOLN
INTRAVENOUS | Status: DC
  Administered 2017-06-30 (×2): via INTRAVENOUS

## 2017-06-30 MED ORDER — METOCLOPRAMIDE HCL 5 MG PO TABS: 5.0000 mg | ORAL_TABLET | Freq: Three times a day (TID) | ORAL | Status: DC | PRN

## 2017-06-30 MED ORDER — OXYCODONE HCL 5 MG PO TABS
10.0000 mg | ORAL_TABLET | ORAL | Status: DC | PRN
  Administered 2017-06-30 – 2017-07-02 (×6): 10 mg via ORAL
  Filled 2017-06-30 (×6): qty 2

## 2017-06-30 MED ORDER — FENTANYL CITRATE (PF) 100 MCG/2ML IJ SOLN: 25.0000 ug | INTRAMUSCULAR | Status: DC | PRN

## 2017-06-30 MED ORDER — SODIUM CHLORIDE 0.9 % IR SOLN
Status: DC | PRN
  Administered 2017-06-30: 1000 mL

## 2017-06-30 MED ORDER — FENTANYL CITRATE (PF) 100 MCG/2ML IJ SOLN
50.0000 ug | INTRAMUSCULAR | Status: DC
  Administered 2017-06-30: 100 ug via INTRAVENOUS

## 2017-06-30 MED ORDER — LISINOPRIL 20 MG PO TABS
40.0000 mg | ORAL_TABLET | Freq: Every day | ORAL | Status: DC
  Administered 2017-07-02: 40 mg via ORAL
  Filled 2017-06-30 (×3): qty 2

## 2017-06-30 MED ORDER — METHOCARBAMOL 1000 MG/10ML IJ SOLN
500.0000 mg | Freq: Four times a day (QID) | INTRAVENOUS | Status: DC | PRN
  Filled 2017-06-30: qty 5

## 2017-06-30 MED ORDER — CEFAZOLIN SODIUM-DEXTROSE 1-4 GM/50ML-% IV SOLN
1.0000 g | Freq: Four times a day (QID) | INTRAVENOUS | Status: AC
  Administered 2017-06-30 – 2017-07-01 (×2): 1 g via INTRAVENOUS
  Filled 2017-06-30 (×2): qty 50

## 2017-06-30 MED ORDER — STERILE WATER FOR IRRIGATION IR SOLN
Status: DC | PRN
  Administered 2017-06-30: 2000 mL

## 2017-06-30 MED ORDER — ONDANSETRON HCL 4 MG/2ML IJ SOLN
4.0000 mg | Freq: Four times a day (QID) | INTRAMUSCULAR | Status: DC | PRN
  Administered 2017-07-01: 4 mg via INTRAVENOUS
  Filled 2017-06-30: qty 2

## 2017-06-30 MED ORDER — ACETAMINOPHEN 650 MG RE SUPP: 650.0000 mg | RECTAL | Status: DC | PRN

## 2017-06-30 MED ORDER — DEXAMETHASONE SODIUM PHOSPHATE 10 MG/ML IJ SOLN
INTRAMUSCULAR | Status: DC | PRN
Start: 2017-06-30 — End: 2017-06-30
  Administered 2017-06-30: 10 mg via INTRAVENOUS

## 2017-06-30 MED ORDER — POLYETHYLENE GLYCOL 3350 17 G PO PACK: 17.0000 g | PACK | Freq: Every day | ORAL | Status: DC | PRN

## 2017-06-30 MED ORDER — EPHEDRINE SULFATE-NACL 50-0.9 MG/10ML-% IV SOSY
PREFILLED_SYRINGE | INTRAVENOUS | Status: DC | PRN
  Administered 2017-06-30 (×3): 10 mg via INTRAVENOUS

## 2017-06-30 MED ORDER — PROPOFOL 10 MG/ML IV BOLUS
INTRAVENOUS | Status: DC | PRN
  Administered 2017-06-30 (×3): 20 mg via INTRAVENOUS

## 2017-06-30 MED ORDER — CEFAZOLIN SODIUM-DEXTROSE 2-4 GM/100ML-% IV SOLN
INTRAVENOUS | Status: AC
  Filled 2017-06-30: qty 100

## 2017-06-30 MED ORDER — DIPHENHYDRAMINE HCL 12.5 MG/5ML PO ELIX: 12.5000 mg | ORAL_SOLUTION | ORAL | Status: DC | PRN

## 2017-06-30 MED ORDER — ONDANSETRON HCL 4 MG/2ML IJ SOLN
INTRAMUSCULAR | Status: DC | PRN
  Administered 2017-06-30: 4 mg via INTRAVENOUS

## 2017-06-30 MED ORDER — PROPOFOL 500 MG/50ML IV EMUL
INTRAVENOUS | Status: DC | PRN
  Administered 2017-06-30: 80 ug/kg/min via INTRAVENOUS

## 2017-06-30 MED ORDER — METOCLOPRAMIDE HCL 5 MG/ML IJ SOLN
5.0000 mg | Freq: Three times a day (TID) | INTRAMUSCULAR | Status: DC | PRN
Start: 2017-06-30 — End: 2017-07-02
  Administered 2017-07-02: 10 mg via INTRAVENOUS
  Filled 2017-06-30: qty 2

## 2017-06-30 MED ORDER — ASPIRIN EC 325 MG PO TBEC
325.0000 mg | DELAYED_RELEASE_TABLET | Freq: Two times a day (BID) | ORAL | Status: DC
  Administered 2017-07-01 – 2017-07-02 (×3): 325 mg via ORAL
  Filled 2017-06-30 (×3): qty 1

## 2017-06-30 MED ORDER — ALUM & MAG HYDROXIDE-SIMETH 200-200-20 MG/5ML PO SUSP: 30.0000 mL | ORAL | Status: DC | PRN

## 2017-06-30 MED ORDER — MIDAZOLAM HCL 2 MG/2ML IJ SOLN
INTRAMUSCULAR | Status: AC
  Administered 2017-06-30: 1 mg via INTRAVENOUS
  Filled 2017-06-30: qty 2

## 2017-06-30 MED ORDER — TRANEXAMIC ACID 1000 MG/10ML IV SOLN
1000.0000 mg | INTRAVENOUS | Status: AC
  Administered 2017-06-30: 1000 mg via INTRAVENOUS
  Filled 2017-06-30: qty 1100

## 2017-06-30 MED ORDER — CEFAZOLIN SODIUM-DEXTROSE 2-3 GM-%(50ML) IV SOLR
INTRAVENOUS | Status: DC | PRN
  Administered 2017-06-30: 2 g via INTRAVENOUS

## 2017-06-30 MED ORDER — HYDROMORPHONE HCL 1 MG/ML IJ SOLN: 1.0000 mg | INTRAMUSCULAR | Status: DC | PRN

## 2017-06-30 MED ORDER — DOCUSATE SODIUM 100 MG PO CAPS
100.0000 mg | ORAL_CAPSULE | Freq: Two times a day (BID) | ORAL | Status: DC
  Administered 2017-06-30 – 2017-07-02 (×4): 100 mg via ORAL
  Filled 2017-06-30 (×4): qty 1

## 2017-06-30 MED ORDER — PHENOL 1.4 % MT LIQD: 1.0000 | OROMUCOSAL | Status: DC | PRN

## 2017-06-30 MED ORDER — AMLODIPINE BESYLATE 10 MG PO TABS
10.0000 mg | ORAL_TABLET | Freq: Every day | ORAL | Status: DC
  Administered 2017-07-01 – 2017-07-02 (×2): 10 mg via ORAL
  Filled 2017-06-30 (×3): qty 1

## 2017-06-30 MED ORDER — ONDANSETRON HCL 4 MG PO TABS: 4.0000 mg | ORAL_TABLET | Freq: Four times a day (QID) | ORAL | Status: DC | PRN

## 2017-06-30 MED ORDER — METHOCARBAMOL 500 MG PO TABS
500.0000 mg | ORAL_TABLET | Freq: Four times a day (QID) | ORAL | Status: DC | PRN
  Administered 2017-06-30 – 2017-07-02 (×4): 500 mg via ORAL
  Filled 2017-06-30 (×4): qty 1

## 2017-06-30 MED ORDER — ACETAMINOPHEN 325 MG PO TABS: 650.0000 mg | ORAL_TABLET | ORAL | Status: DC | PRN

## 2017-06-30 MED ORDER — CEFAZOLIN SODIUM-DEXTROSE 2-4 GM/100ML-% IV SOLN: 2.0000 g | INTRAVENOUS | Status: DC

## 2017-06-30 MED ORDER — ATORVASTATIN CALCIUM 40 MG PO TABS
40.0000 mg | ORAL_TABLET | Freq: Every day | ORAL | Status: DC
  Administered 2017-07-01 – 2017-07-02 (×2): 40 mg via ORAL
  Filled 2017-06-30 (×2): qty 1

## 2017-06-30 MED ORDER — HYDROCODONE-ACETAMINOPHEN 5-325 MG PO TABS
1.0000 | ORAL_TABLET | ORAL | Status: DC | PRN
  Administered 2017-06-30 – 2017-07-02 (×3): 2 via ORAL
  Filled 2017-06-30 (×3): qty 2

## 2017-06-30 MED ORDER — PROPOFOL 10 MG/ML IV BOLUS
INTRAVENOUS | Status: AC
  Filled 2017-06-30: qty 40

## 2017-06-30 MED ORDER — MENTHOL 3 MG MT LOZG: 1.0000 | LOZENGE | OROMUCOSAL | Status: DC | PRN

## 2017-06-30 SURGICAL SUPPLY — 48 items
APL SKNCLS STERI-STRIP NONHPOA (GAUZE/BANDAGES/DRESSINGS) ×1
BAG SPEC THK2 15X12 ZIP CLS (MISCELLANEOUS)
BAG ZIPLOCK 12X15 (MISCELLANEOUS) IMPLANT
BANDAGE ACE 6X5 VEL STRL LF (GAUZE/BANDAGES/DRESSINGS) ×2 IMPLANT
BENZOIN TINCTURE PRP APPL 2/3 (GAUZE/BANDAGES/DRESSINGS) ×1 IMPLANT
BLADE SAG 18X100X1.27 (BLADE) IMPLANT
BOWL SMART MIX CTS (DISPOSABLE) IMPLANT
CAPT KNEE TOTAL 3 ×1 IMPLANT
CEMENT BONE SIMPLEX SPEEDSET (Cement) ×2 IMPLANT
COVER SURGICAL LIGHT HANDLE (MISCELLANEOUS) ×2 IMPLANT
CUFF TOURN SGL QUICK 34 (TOURNIQUET CUFF) ×2
CUFF TRNQT CYL 34X4X40X1 (TOURNIQUET CUFF) ×1 IMPLANT
DRAPE U-SHAPE 47X51 STRL (DRAPES) ×2 IMPLANT
DRSG AQUACEL AG ADV 3.5X10 (GAUZE/BANDAGES/DRESSINGS) ×2 IMPLANT
DRSG PAD ABDOMINAL 8X10 ST (GAUZE/BANDAGES/DRESSINGS) ×2 IMPLANT
DURAPREP 26ML APPLICATOR (WOUND CARE) ×2 IMPLANT
ELECT REM PT RETURN 15FT ADLT (MISCELLANEOUS) ×2 IMPLANT
GAUZE SPONGE 4X4 12PLY STRL (GAUZE/BANDAGES/DRESSINGS) ×2 IMPLANT
GAUZE XEROFORM 1X8 LF (GAUZE/BANDAGES/DRESSINGS) IMPLANT
GLOVE BIO SURGEON STRL SZ7.5 (GLOVE) ×2 IMPLANT
GLOVE BIOGEL PI IND STRL 8 (GLOVE) ×2 IMPLANT
GLOVE BIOGEL PI INDICATOR 8 (GLOVE) ×2
GLOVE ECLIPSE 8.0 STRL XLNG CF (GLOVE) ×2 IMPLANT
GOWN STRL REUS W/TWL XL LVL3 (GOWN DISPOSABLE) ×4 IMPLANT
HANDPIECE INTERPULSE COAX TIP (DISPOSABLE) ×2
IMMOBILIZER KNEE 20 (SOFTGOODS) ×2
IMMOBILIZER KNEE 20 THIGH 36 (SOFTGOODS) ×1 IMPLANT
NS IRRIG 1000ML POUR BTL (IV SOLUTION) ×2 IMPLANT
PACK TOTAL KNEE CUSTOM (KITS) ×2 IMPLANT
PAD ABD 7.5X8 STRL (GAUZE/BANDAGES/DRESSINGS) ×1 IMPLANT
PADDING CAST COTTON 6X4 STRL (CAST SUPPLIES) ×3 IMPLANT
POSITIONER SURGICAL ARM (MISCELLANEOUS) ×2 IMPLANT
SET HNDPC FAN SPRY TIP SCT (DISPOSABLE) ×1 IMPLANT
SET PAD KNEE POSITIONER (MISCELLANEOUS) ×2 IMPLANT
STAPLER VISISTAT 35W (STAPLE) IMPLANT
STRIP CLOSURE SKIN 1/2X4 (GAUZE/BANDAGES/DRESSINGS) ×1 IMPLANT
STRIP CLOSURE SKIN 1/4X3 (GAUZE/BANDAGES/DRESSINGS) ×1 IMPLANT
SUT MNCRL AB 4-0 PS2 18 (SUTURE) IMPLANT
SUT VIC AB 0 CT1 27 (SUTURE) ×2
SUT VIC AB 0 CT1 27XBRD ANTBC (SUTURE) ×1 IMPLANT
SUT VIC AB 1 CT1 27 (SUTURE) ×4
SUT VIC AB 1 CT1 27XBRD ANTBC (SUTURE) ×2 IMPLANT
SUT VIC AB 2-0 CT1 27 (SUTURE) ×4
SUT VIC AB 2-0 CT1 TAPERPNT 27 (SUTURE) ×2 IMPLANT
TRAY FOLEY W/METER SILVER 16FR (SET/KITS/TRAYS/PACK) ×2 IMPLANT
WATER STERILE IRR 1000ML POUR (IV SOLUTION) ×2 IMPLANT
WRAP KNEE MAXI GEL POST OP (GAUZE/BANDAGES/DRESSINGS) ×2 IMPLANT
YANKAUER SUCT BULB TIP 10FT TU (MISCELLANEOUS) ×2 IMPLANT

## 2017-06-30 NOTE — Anesthesia Procedure Notes (Signed)
Spinal  Patient location during procedure: OR Staffing Anesthesiologist: Lyndle Herrlich, MD Spinal Block Patient position: sitting Prep: DuraPrep Patient monitoring: heart rate, blood pressure and continuous pulse ox Approach: right paramedian Location: L3-4 Injection technique: single-shot Needle Needle type: Sprotte and Tuohy  Needle gauge: 24 G Needle length: 9 cm Assessment Sensory level: T4 Additional Notes Attempted with sprotte Changed to tuohy with 9cm LOR sprotte thru tuohy Clear CSF Spinal Dosage in OR  .75% Bupivicaine ml       1.8 RLD x 3 min

## 2017-06-30 NOTE — Progress Notes (Signed)
AssistedDr. Lyndle Herrlich with right, ultrasound guided, adductor canal block. Side rails up, monitors on throughout procedure. See vital signs in flow sheet. Tolerated Procedure well.

## 2017-06-30 NOTE — Anesthesia Procedure Notes (Signed)
Anesthesia Regional Block: Adductor canal block   Pre-Anesthetic Checklist: ,, timeout performed, Correct Patient, Correct Site, Correct Laterality, Correct Procedure, Correct Position, site marked, Risks and benefits discussed, pre-op evaluation,  At surgeon's request and post-op pain management  Laterality: Right  Prep: chloraprep       Needles:   Needle Type: Echogenic Needle     Needle Length: 9cm  Needle Gauge: 21     Additional Needles:   Procedures:,,,, ultrasound used (permanent image in chart),,,,  Narrative:  Start time: 06/30/2017 2:13 PM End time: 06/30/2017 2:22 PM Anesthesiologist: Lyndle Herrlich, MD

## 2017-06-30 NOTE — Anesthesia Procedure Notes (Signed)
Procedure Name: MAC Date/Time: 06/30/2017 4:00 PM Performed by: West Pugh, CRNA Pre-anesthesia Checklist: Patient identified, Emergency Drugs available and Suction available Patient Re-evaluated:Patient Re-evaluated prior to induction Oxygen Delivery Method: Nasal cannula Placement Confirmation: positive ETCO2 and CO2 detector Dental Injury: Teeth and Oropharynx as per pre-operative assessment

## 2017-06-30 NOTE — Anesthesia Preprocedure Evaluation (Addendum)
Anesthesia Evaluation  Patient identified by MRN, date of birth, ID band Patient awake    Reviewed: Allergy & Precautions, H&P , NPO status , Patient's Chart, lab work & pertinent test results  Airway Mallampati: II  TM Distance: >3 FB Neck ROM: Full    Dental no notable dental hx. (+) Edentulous Upper, Edentulous Lower, Dental Advisory Given   Pulmonary neg pulmonary ROS,    Pulmonary exam normal breath sounds clear to auscultation       Cardiovascular Exercise Tolerance: Good hypertension, Pt. on medications  Rhythm:Regular Rate:Normal     Neuro/Psych Depression negative neurological ROS     GI/Hepatic negative GI ROS, Neg liver ROS,   Endo/Other  negative endocrine ROS  Renal/GU negative Renal ROS  negative genitourinary   Musculoskeletal  (+) Arthritis , Osteoarthritis,    Abdominal   Peds  Hematology negative hematology ROS (+)   Anesthesia Other Findings   Reproductive/Obstetrics negative OB ROS                             Anesthesia Physical  Anesthesia Plan  ASA: II  Anesthesia Plan: Spinal   Post-op Pain Management:  Regional for Post-op pain   Induction: Intravenous  PONV Risk Score and Plan: 2 and Treatment may vary due to age or medical condition, Dexamethasone and Ondansetron  Airway Management Planned: Simple Face Mask  Additional Equipment:   Intra-op Plan:   Post-operative Plan:   Informed Consent: I have reviewed the patients History and Physical, chart, labs and discussed the procedure including the risks, benefits and alternatives for the proposed anesthesia with the patient or authorized representative who has indicated his/her understanding and acceptance.   Dental advisory given  Plan Discussed with: CRNA  Anesthesia Plan Comments:         Anesthesia Quick Evaluation

## 2017-06-30 NOTE — H&P (Signed)
TOTAL KNEE ADMISSION H&P  Patient is being admitted for right total knee arthroplasty.  Subjective:  Chief Complaint:right knee pain.  HPI: Sherri Delgado, 71 y.o. female, has a history of pain and functional disability in the right knee due to arthritis and has failed non-surgical conservative treatments for greater than 12 weeks to includeNSAID's and/or analgesics, corticosteriod injections, viscosupplementation injections, flexibility and strengthening excercises, supervised PT with diminished ADL's post treatment, use of assistive devices, weight reduction as appropriate and activity modification.  Onset of symptoms was gradual, starting 3 years ago with gradually worsening course since that time. The patient noted no past surgery on the right knee(s).  Patient currently rates pain in the right knee(s) at 10 out of 10 with activity. Patient has night pain, worsening of pain with activity and weight bearing, pain that interferes with activities of daily living, pain with passive range of motion, crepitus and joint swelling.  Patient has evidence of subchondral sclerosis, periarticular osteophytes and joint space narrowing by imaging studies. There is no active infection.  Patient Active Problem List   Diagnosis Date Noted  . Anemia 11/02/2016  . Sleep disturbance 11/02/2016  . Status post total left knee replacement 08/26/2016  . Unilateral primary osteoarthritis, right knee 07/26/2016  . Unilateral primary osteoarthritis, left knee 07/26/2016  . Preventative health care 11/04/2014  . OBESITY 07/16/2008  . CERVICAL CANCER 05/10/2006  . Hyperlipidemia 05/10/2006  . HTN (hypertension) 05/10/2006  . Osteopenia 05/10/2006   Past Medical History:  Diagnosis Date  . Arthritis   . Cancer ([place])    cervical in her 40's  . Depression    Hx of with divorce  . History of cervical cancer    s/p hysterectomyy. Most recent pap in 2009 normal  . Hyperlipidemia   . Hypertension   . Obesity   .  Osteopenia    DEXA 08/2003, T score lumbar -1.9    Past Surgical History:  Procedure Laterality Date  . ABDOMINAL HYSTERECTOMY  1998   In [place] "baby maker gone, playpen still there"  . COLONOSCOPY  2013   normal   . EYE SURGERY  1998   cataract surgery  . TOTAL KNEE ARTHROPLASTY Left 08/26/2016   Procedure: LEFT TOTAL KNEE ARTHROPLASTY;  Surgeon: Mcarthur Rossetti, MD;  Location: [facility];  Service: Orthopedics;  Laterality: Left;    Current Facility-Administered Medications  Medication Dose Route Frequency Provider Last Rate Last Dose  . ceFAZolin (ANCEF) 2-4 GM/100ML-% IVPB           . ceFAZolin (ANCEF) IVPB 2g/100 mL premix  2 g Intravenous On Call to OR Mcarthur Rossetti, MD      . fentaNYL (SUBLIMAZE) 100 MCG/2ML injection           . fentaNYL (SUBLIMAZE) injection 50 mcg  50 mcg Intravenous UD Lyndle Herrlich, MD      . lactated ringers infusion   Intravenous Continuous Lyndle Herrlich, MD 20 mL/hr at 06/30/17 1211    . midazolam (VERSED) 2 MG/2ML injection           . midazolam (VERSED) injection 1-2 mg  1-2 mg Intravenous UD Lyndle Herrlich, MD      . tranexamic acid (CYKLOKAPRON) 1,000 mg in sodium chloride 0.9 % 100 mL IVPB  1,000 mg Intravenous To OR Mcarthur Rossetti, MD       No Known Allergies  Social History   Tobacco Use  . Smoking status: Never Smoker  . Smokeless tobacco: Never Used  Substance  Use Topics  . Alcohol use: No    Alcohol/week: 0.6 oz    Types: 1 Glasses of wine per week    Frequency: Never    Family History  Problem Relation Age of Onset  . Cancer Mother        died from brain tumor  . Cancer Father        died from liver cancer and cirrhosis  . Diabetes Brother   . Aneurysm Sister        died from brain aneurysm bleed  . Colon cancer Neg Hx   . Stomach cancer Neg Hx   . Rectal cancer Neg Hx   . Colon polyps Neg Hx      Review of Systems  Musculoskeletal: Positive for joint pain.  All other systems reviewed and are  negative.   Objective:  Physical Exam  Constitutional: She is oriented to person, place, and time. She appears well-developed and well-nourished.  HENT:  Head: Normocephalic and atraumatic.  Eyes: EOM are normal. Pupils are equal, round, and reactive to light.  Neck: Normal range of motion. Neck supple.  Cardiovascular: Normal rate and regular rhythm.  Respiratory: Effort normal and breath sounds normal.  GI: Soft. Bowel sounds are normal.  Musculoskeletal:       Right knee: She exhibits decreased range of motion, swelling, effusion and abnormal alignment. Tenderness found. Medial joint line and lateral joint line tenderness noted.  Neurological: She is alert and oriented to person, place, and time.  Skin: Skin is warm and dry.  Psychiatric: She has a normal mood and affect.    Vital signs in last 24 hours: Temp:  [98.6 F (37 C)] 98.6 F (37 C) (12/28 1157) Pulse Rate:  [74] 74 (12/28 1157) Resp:  [16] 16 (12/28 1157) BP: (153)/(77) 153/77 (12/28 1158) SpO2:  [100 %] 100 % (12/28 1158) Weight:  [207 lb (93.9 kg)] 207 lb (93.9 kg) (12/28 1157)  Labs:   Estimated body mass index is 34.45 kg/m as calculated from the following:   Height as of this encounter: 5\' 5"  (1.651 m).   Weight as of this encounter: 207 lb (93.9 kg).   Imaging Review Plain radiographs demonstrate severe degenerative joint disease of the right knee(s). The overall alignment ismild varus. The bone quality appears to be good for age and reported activity level.  Assessment/Plan:  End stage arthritis, right knee   The patient history, physical examination, clinical judgment of the provider and imaging studies are consistent with end stage degenerative joint disease of the right knee(s) and total knee arthroplasty is deemed medically necessary. The treatment options including medical management, injection therapy arthroscopy and arthroplasty were discussed at length. The risks and benefits of total knee  arthroplasty were presented and reviewed. The risks due to aseptic loosening, infection, stiffness, patella tracking problems, thromboembolic complications and other imponderables were discussed. The patient acknowledged the explanation, agreed to proceed with the plan and consent was signed. Patient is being admitted for inpatient treatment for surgery, pain control, PT, OT, prophylactic antibiotics, VTE prophylaxis, progressive ambulation and ADL's and discharge planning. The patient is planning to be discharged home with home health services

## 2017-06-30 NOTE — Transfer of Care (Signed)
Immediate Anesthesia Transfer of Care Note  Patient: Sherri Delgado  Procedure(s) Performed: RIGHT TOTAL KNEE ARTHROPLASTY (Right Knee)  Patient Location: PACU  Anesthesia Type:MAC and Spinal  Level of Consciousness: awake, alert  and oriented  Airway & Oxygen Therapy: Patient Spontanous Breathing and Patient connected to face mask oxygen  Post-op Assessment: Report given to RN and Post -op Vital signs reviewed and stable  Post vital signs: Reviewed and stable  Last Vitals:  Vitals:   06/30/17 1514 06/30/17 1515  BP:  131/79  Pulse: (!) 57 (!) 55  Resp: 15 17  Temp:    SpO2: 98% 96%    Last Pain:  Vitals:   06/30/17 1157  TempSrc: Oral      Patients Stated Pain Goal: 4 (99/83/38 2505)  Complications: No apparent anesthesia complications

## 2017-06-30 NOTE — Brief Op Note (Signed)
06/30/2017  6:16 PM  PATIENT:  Sherri Delgado  71 y.o. female  PRE-OPERATIVE DIAGNOSIS:  right knee osteoarthritis  POST-OPERATIVE DIAGNOSIS:  right knee osteoarthritis  PROCEDURE:  Procedure(s): RIGHT TOTAL KNEE ARTHROPLASTY (Right)  SURGEON:  Surgeon(s) and Role:    Mcarthur Rossetti, MD - Primary  ANESTHESIA:   regional and spinal  EBL:  50 mL   COUNTS:  YES  TOURNIQUET:   Total Tourniquet Time Documented: Thigh (Right) - 57 minutes Total: Thigh (Right) - 57 minutes   DICTATION: .Other Dictation: Dictation Number [id number]  PLAN OF CARE: Admit to inpatient   PATIENT DISPOSITION:  PACU - hemodynamically stable.   Delay start of Pharmacological VTE agent (>24hrs) due to surgical blood loss or risk of bleeding: no

## 2017-07-01 LAB — CBC
HEMATOCRIT: 32.5 % — AB (ref 36.0–46.0)
HEMOGLOBIN: 11.2 g/dL — AB (ref 12.0–15.0)
MCH: 28.1 pg (ref 26.0–34.0)
MCHC: 34.5 g/dL (ref 30.0–36.0)
MCV: 81.7 fL (ref 78.0–100.0)
Platelets: 213 10*3/uL (ref 150–400)
RBC: 3.98 MIL/uL (ref 3.87–5.11)
RDW: 13.2 % (ref 11.5–15.5)
WBC: 10 10*3/uL (ref 4.0–10.5)

## 2017-07-01 LAB — BASIC METABOLIC PANEL
Anion gap: 9 (ref 5–15)
BUN: 12 mg/dL (ref 6–20)
CHLORIDE: 103 mmol/L (ref 101–111)
CO2: 27 mmol/L (ref 22–32)
CREATININE: 0.78 mg/dL (ref 0.44–1.00)
Calcium: 9.2 mg/dL (ref 8.9–10.3)
GFR calc Af Amer: >60 mL/min (ref >60)
GFR calc non Af Amer: >60 mL/min (ref >60)
GLUCOSE: 160 mg/dL — AB (ref 65–99)
Potassium: 3.9 mmol/L (ref 3.5–5.1)
Sodium: 139 mmol/L (ref 135–145)

## 2017-07-01 MED ORDER — ASPIRIN 325 MG PO TBEC: 325.0000 mg | DELAYED_RELEASE_TABLET | Freq: Two times a day (BID) | ORAL | 0 refills | Status: DC

## 2017-07-01 MED ORDER — METHOCARBAMOL 500 MG PO TABS: 500.0000 mg | ORAL_TABLET | Freq: Four times a day (QID) | ORAL | 0 refills | Status: DC | PRN

## 2017-07-01 MED ORDER — OXYCODONE-ACETAMINOPHEN 5-325 MG PO TABS: 1.0000 | ORAL_TABLET | ORAL | 0 refills | Status: DC | PRN

## 2017-07-01 NOTE — Progress Notes (Signed)
Physical Therapy Treatment Patient Details Name: Sherri Delgado MRN: [id number] DOB: [birth year] Today's Date: 07/01/2017    History of Present Illness 71 yo female s/p R TKA 06/30/17. Hx of L TKA 08/2016    PT Comments    Progressing with mobility. Pt is planning to d/c home on tomorrow.    Follow Up Recommendations  Home health PT;Supervision - Intermittent     Equipment Recommendations  None recommended by PT    Recommendations for Other Services OT consult     Precautions / Restrictions Precautions Precautions: Fall;Knee Required Braces or Orthoses: Knee Immobilizer - Right(able to d/c-pt did SLR) Restrictions Weight Bearing Restrictions: No RLE Weight Bearing: Weight bearing as tolerated    Mobility  Bed Mobility Overal bed mobility: Needs Assistance Bed Mobility: Supine to Sit;Sit to Supine     Supine to sit: Supervision;HOB elevated Sit to supine: Supervision;HOB elevated   General bed mobility comments: for safety.   Transfers Overall transfer level: Needs assistance Equipment used: Rolling walker (2 wheeled) Transfers: Sit to/from Stand Sit to Stand: Supervision         General transfer comment: for safety. VCs hand placement  Ambulation/Gait Ambulation/Gait assistance: Min guard Ambulation Distance (Feet): 110 Feet Assistive device: Rolling walker (2 wheeled) Gait Pattern/deviations: Step-to pattern;Step-through pattern;Decreased step length - right;Decreased stride length     General Gait Details: close guard for safety. slow gait speed.    Stairs            Wheelchair Mobility    Modified Rankin (Stroke Patients Only)       Balance                                            Cognition Arousal/Alertness: Awake/alert Behavior During Therapy: WFL for tasks assessed/performed Overall Cognitive Status: Within Functional Limits for tasks assessed                                         Exercises Total Joint Exercises Ankle Circles/Pumps: AROM;Both;10 reps;Supine Quad Sets: AROM;Both;10 reps;Supine Heel Slides: AAROM;Right;10 reps;Supine Straight Leg Raises: AROM;Right;10 reps;Supine Goniometric ROM: ~10-60 degrees    General Comments        Pertinent Vitals/Pain Pain Assessment: 0-10 Pain Score: 5  Pain Location: R knee Pain Descriptors / Indicators: Sore;Burning Pain Intervention(s): Repositioned;Ice applied    Home Living Family/patient expects to be discharged to:: Private residence Living Arrangements: Alone Available Help at Discharge: Available PRN/intermittently;Family;Friend(s) Type of Home: Apartment Home Access: Level entry(2nd entrance has not steps to enter)   Home Layout: One level Home Equipment: Bedside commode;Shower seat;Grab bars - tub/shower;Walker - 2 wheels      Prior Function Level of Independence: Independent      Comments: niece will check on (per pt)   PT Goals (current goals can now be found in the care plan section) Acute Rehab PT Goals Patient Stated Goal: plan my next birthday getaway PT Goal Formulation: With patient Time For Goal Achievement: 07/15/17 Potential to Achieve Goals: Good Progress towards PT goals: Progressing toward goals    Frequency    7X/week      PT Plan Current plan remains appropriate    Co-evaluation              AM-PAC PT "6 Clicks" Daily Activity  Outcome Measure  Difficulty turning over in bed (including adjusting bedclothes, sheets and blankets)?: None Difficulty moving from lying on back to sitting on the side of the bed? : None Difficulty sitting down on and standing up from a chair with arms (e.g., wheelchair, bedside commode, etc,.)?: A Little Help needed moving to and from a bed to chair (including a wheelchair)?: A Little Help needed walking in hospital room?: A Little Help needed climbing 3-5 steps with a railing? : A Little 6 Click Score: 20    End of Session  Equipment Utilized During Treatment: Gait belt Activity Tolerance: Patient tolerated treatment well Patient left: in bed;with call bell/phone within reach;with family/visitor present   PT Visit Diagnosis: Pain;Difficulty in walking, not elsewhere classified (R26.2) Pain - Right/Left: Right Pain - part of body: Knee     Time: 5993-5701 PT Time Calculation (min) (ACUTE ONLY): 25 min  Charges:  $Gait Training: 23-37 mins                    G Codes:          Weston Anna, MPT Pager: [phone]

## 2017-07-01 NOTE — Evaluation (Signed)
Physical Therapy Evaluation Patient Details Name: Sherri Delgado MRN: [id number] DOB: May 18, 1946 Today's Date: 07/01/2017   History of Present Illness  71 yo female s/p R TKA 06/30/17. Hx of L TKA 08/2016  Clinical Impression  On eval, pt was Min guard assist for mobility. She walked ~100 feet with a RW. Pain rated 5/10. She c/o feeling "hot" and nauseous towards end of walk. Pt was able to safely make it back to the recliner. Will continue to follow and progress activity as tolerated.     Follow Up Recommendations Home health PT    Equipment Recommendations  None recommended by PT    Recommendations for Other Services OT consult     Precautions / Restrictions Precautions Precautions: Fall;Knee Required Braces or Orthoses: Knee Immobilizer - Right Restrictions Weight Bearing Restrictions: No RLE Weight Bearing: Weight bearing as tolerated      Mobility  Bed Mobility Overal bed mobility: Needs Assistance Bed Mobility: Supine to Sit     Supine to sit: Supervision;HOB elevated     General bed mobility comments: for safety.   Transfers Overall transfer level: Needs assistance Equipment used: Rolling walker (2 wheeled) Transfers: Sit to/from Stand Sit to Stand: Min guard         General transfer comment: close guard for safety. VC safety, hand/LE placement  Ambulation/Gait Ambulation/Gait assistance: Min guard Ambulation Distance (Feet): 100 Feet Assistive device: Rolling walker (2 wheeled) Gait Pattern/deviations: Step-through pattern;Decreased step length - right;Antalgic     General Gait Details: close guard for safety. slow gait speed. Pt became "hot" and nauseous towards end of walk. She c/o mild lightheadedness.   Stairs            Wheelchair Mobility    Modified Rankin (Stroke Patients Only)       Balance                                             Pertinent Vitals/Pain Pain Assessment: 0-10 Pain Score: 5  Pain  Location: R knee Pain Descriptors / Indicators: Sore;Burning Pain Intervention(s): Monitored during session;Repositioned;Ice applied    Home Living Family/patient expects to be discharged to:: Private residence Living Arrangements: Alone Available Help at Discharge: Available PRN/intermittently;Family;Friend(s) Type of Home: Apartment Home Access: Level entry(2nd entrance has not steps to enter)     Home Layout: One level Home Equipment: Bedside commode;Shower seat;Grab bars - tub/shower;Walker - 2 wheels      Prior Function Level of Independence: Independent         Comments: niece will check on (per pt)     Hand Dominance        Extremity/Trunk Assessment   Upper Extremity Assessment Upper Extremity Assessment: Defer to OT evaluation    Lower Extremity Assessment Lower Extremity Assessment: Generalized weakness(s/p R TKA)    Cervical / Trunk Assessment Cervical / Trunk Assessment: Normal  Communication   Communication: No difficulties  Cognition Arousal/Alertness: Awake/alert Behavior During Therapy: WFL for tasks assessed/performed Overall Cognitive Status: Within Functional Limits for tasks assessed                                        General Comments      Exercises Total Joint Exercises Ankle Circles/Pumps: AROM;Both;10 reps;Supine Quad Sets: AROM;Both;10 reps;Supine Heel Slides: AAROM;Right;10 reps;Supine  Straight Leg Raises: AROM;Right;10 reps;Supine Goniometric ROM: ~10-60 degrees   Assessment/Plan    PT Assessment Patient needs continued PT services  PT Problem List Decreased strength;Decreased mobility;Decreased range of motion;Decreased activity tolerance;Decreased balance;Decreased knowledge of use of DME;Pain;Decreased knowledge of precautions       PT Treatment Interventions DME instruction;Gait training;Therapeutic activities;Therapeutic exercise;Patient/family education;Functional mobility training    PT Goals  (Current goals can be found in the Care Plan section)  Acute Rehab PT Goals Patient Stated Goal: plan my next birthday getaway PT Goal Formulation: With patient Time For Goal Achievement: 07/15/17 Potential to Achieve Goals: Good    Frequency 7X/week   Barriers to discharge        Co-evaluation               AM-PAC PT "6 Clicks" Daily Activity  Outcome Measure Difficulty turning over in bed (including adjusting bedclothes, sheets and blankets)?: None Difficulty moving from lying on back to sitting on the side of the bed? : None Difficulty sitting down on and standing up from a chair with arms (e.g., wheelchair, bedside commode, etc,.)?: A Little Help needed moving to and from a bed to chair (including a wheelchair)?: A Little Help needed walking in hospital room?: A Little Help needed climbing 3-5 steps with a railing? : A Little 6 Click Score: 20    End of Session Equipment Utilized During Treatment: Gait belt Activity Tolerance: Patient tolerated treatment well Patient left: in chair;with call bell/phone within reach   PT Visit Diagnosis: Pain;Difficulty in walking, not elsewhere classified (R26.2) Pain - Right/Left: Right Pain - part of body: Knee    Time: 1975-8832 PT Time Calculation (min) (ACUTE ONLY): 24 min   Charges:   PT Evaluation $PT Eval Low Complexity: 1 Low PT Treatments $Gait Training: 8-22 mins   PT G Codes:          Weston Anna, MPT Pager: [phone]

## 2017-07-01 NOTE — Evaluation (Signed)
Occupational Therapy Evaluation Patient Details Name: Jovanni Eckhart MRN: [id number] DOB: July 24, 1945 Today's Date: 07/01/2017    History of Present Illness R TKA   Clinical Impression   This 71 year old female was admitted for the above sx. All education was completed.  Pt recently had L TKA. She has all DME. She verbalizes understanding of safety and precautions    Follow Up Recommendations  Supervision - Intermittent    Equipment Recommendations  None recommended by OT    Recommendations for Other Services       Precautions / Restrictions Precautions Precautions: Knee;Fall Restrictions Weight Bearing Restrictions: No      Mobility Bed Mobility               General bed mobility comments: oob  Transfers Overall transfer level: Needs assistance Equipment used: Rolling walker (2 wheeled) Transfers: Sit to/from Stand Sit to Stand: Supervision              Balance                                           ADL either performed or assessed with clinical judgement   ADL Overall ADL's : Needs assistance/impaired                     Lower Body Dressing: Minimal assistance;Sit to/from stand   Toilet Transfer: Supervision/safety;Ambulation;BSC;RW             General ADL Comments: pt is at mostly supervision level to gather supplies and complete ADL.  She is agreeable to washing at sink for a few days if needed; she was able to get into tub after last sx.  Reminded pt to be vigilant with safety, as she is independent natured and has been through this before.  One cue given during this session.  Pt has reacher but can don clothing without this. She will need assistance for ted hose and non-skid socks.  Reviewed safety and knee precautions and pt verbalizes understanding     Vision         Perception     Praxis      Pertinent Vitals/Pain Pain Assessment: 0-10 Pain Score: 2  Pain Location: R knee Pain Descriptors /  Indicators: Sore Pain Intervention(s): Limited activity within patient's tolerance;Monitored during session;Premedicated before session;Repositioned     Hand Dominance     Extremity/Trunk Assessment Upper Extremity Assessment Upper Extremity Assessment: Overall WFL for tasks assessed           Communication Communication Communication: No difficulties   Cognition Arousal/Alertness: Awake/alert Behavior During Therapy: WFL for tasks assessed/performed Overall Cognitive Status: Within Functional Limits for tasks assessed                                     General Comments       Exercises     Shoulder Instructions      Home Living Family/patient expects to be discharged to:: Private residence Living Arrangements: Alone Available Help at Discharge: Available PRN/intermittently;Family;Friend(s)               Bathroom Shower/Tub: Teacher, early years/pre: Standard     Home Equipment: Bedside commode;Shower seat;Grab bars - tub/shower          Prior Functioning/Environment Level  of Independence: Independent;Independent with assistive device(s)        Comments: niece will check daily.  Pt will not shower until she is there        OT Problem List:        OT Treatment/Interventions:      OT Goals(Current goals can be found in the care plan section) Acute Rehab OT Goals Patient Stated Goal: plan my next birthday get a way OT Goal Formulation: All assessment and education complete, DC therapy  OT Frequency:     Barriers to D/C:            Co-evaluation              AM-PAC PT "6 Clicks" Daily Activity     Outcome Measure Help from another person eating meals?: None Help from another person taking care of personal grooming?: A Little Help from another person toileting, which includes using toliet, bedpan, or urinal?: A Little Help from another person bathing (including washing, rinsing, drying)?: A Little Help from  another person to put on and taking off regular upper body clothing?: A Little Help from another person to put on and taking off regular lower body clothing?: A Little 6 Click Score: 19   End of Session    Activity Tolerance: Patient tolerated treatment well Patient left: in chair;with call bell/phone within reach  OT Visit Diagnosis: Pain Pain - Right/Left: Right Pain - part of body: Knee                Time: 6433-2951 OT Time Calculation (min): 18 min Charges:  OT General Charges $OT Visit: 1 Visit OT Evaluation $OT Eval Low Complexity: 1 Low G-Codes:     [place], OTR/L [phone] 07/01/2017  Zaela Graley 07/01/2017, 12:48 PM

## 2017-07-01 NOTE — Progress Notes (Signed)
Subjective: 1 Day Post-Op Procedure(s) (LRB): RIGHT TOTAL KNEE ARTHROPLASTY (Right) Patient reports pain as moderate.    Objective: Vital signs in last 24 hours: Temp:  [97.4 F (36.3 C)-98.6 F (37 C)] 97.9 F (36.6 C) (12/29 0435) Pulse Rate:  [52-84] 62 (12/29 0435) Resp:  [12-27] 18 (12/29 0435) BP: (99-165)/(54-87) 115/57 (12/29 0435) SpO2:  [92 %-100 %] 96 % (12/29 0435) Weight:  [207 lb (93.9 kg)] 207 lb (93.9 kg) (12/28 1157)  Intake/Output from previous day: 12/28 0701 - 12/29 0700 In: 1600 [I.V.:1600] Out: 775 [Urine:725; Blood:50] Intake/Output this shift: No intake/output data recorded.  Recent Labs    07/01/17 0554  HGB 11.2*   Recent Labs    07/01/17 0554  WBC 10.0  RBC 3.98  HCT 32.5*  PLT 213   Recent Labs    07/01/17 0554  NA 139  K 3.9  CL 103  CO2 27  BUN 12  CREATININE 0.78  GLUCOSE 160*  CALCIUM 9.2   No results for input(s): LABPT, INR in the last 72 hours.  Sensation intact distally Intact pulses distally Dorsiflexion/Plantar flexion intact Incision: dressing C/D/I No cellulitis present Compartment soft  Assessment/Plan: 1 Day Post-Op Procedure(s) (LRB): RIGHT TOTAL KNEE ARTHROPLASTY (Right) Up with therapy Plan for discharge tomorrow Discharge home with home health  Mcarthur Rossetti 07/01/2017, 9:02 AM

## 2017-07-02 MED ORDER — HYDROCODONE-ACETAMINOPHEN 5-325 MG PO TABS: 1.0000 | ORAL_TABLET | ORAL | 0 refills | Status: DC | PRN

## 2017-07-02 NOTE — Progress Notes (Signed)
Subjective: 2 Days Post-Op Procedure(s) (LRB): RIGHT TOTAL KNEE ARTHROPLASTY (Right) Patient reports pain as mild.  Doing very well this am and has mobilized well with PT.  Would like to go home this am.  Objective: Vital signs in last 24 hours: Temp:  [98.5 F (36.9 C)] 98.5 F (36.9 C) (12/30 0543) Pulse Rate:  [67-76] 70 (12/30 1011) Resp:  [15-16] 15 (12/30 0543) BP: (102-141)/(58-70) 141/70 (12/30 1011) SpO2:  [96 %-97 %] 96 % (12/30 0543)  Intake/Output from previous day: 12/29 0701 - 12/30 0700 In: 780 [P.O.:780] Out: 350 [Urine:350] Intake/Output this shift: No intake/output data recorded.  Recent Labs    07/01/17 0554  HGB 11.2*   Recent Labs    07/01/17 0554  WBC 10.0  RBC 3.98  HCT 32.5*  PLT 213   Recent Labs    07/01/17 0554  NA 139  K 3.9  CL 103  CO2 27  BUN 12  CREATININE 0.78  GLUCOSE 160*  CALCIUM 9.2   No results for input(s): LABPT, INR in the last 72 hours.  Neurologically intact Neurovascular intact Sensation intact distally Intact pulses distally Dorsiflexion/Plantar flexion intact Incision: dressing C/D/I No cellulitis present Compartment soft  Assessment/Plan: 2 Days Post-Op Procedure(s) (LRB): RIGHT TOTAL KNEE ARTHROPLASTY (Right) Advance diet Up with therapy D/C IV fluids Discharge home with home health this am WBAT RLE  Aundra Dubin 07/02/2017, 10:31 AM

## 2017-07-02 NOTE — Anesthesia Postprocedure Evaluation (Signed)
Anesthesia Post Note  Patient: Sherri Delgado  Procedure(s) Performed: RIGHT TOTAL KNEE ARTHROPLASTY (Right Knee)     Patient location during evaluation: PACU Anesthesia Type: Spinal Level of consciousness: awake Pain management: satisfactory to patient Vital Signs Assessment: post-procedure vital signs reviewed and stable Respiratory status: spontaneous breathing Cardiovascular status: blood pressure returned to baseline Postop Assessment: no headache and spinal receding Anesthetic complications: no    Last Vitals:  Vitals:   07/01/17 2212 07/02/17 0543  BP: (!) 102/58 115/62  Pulse: 67 76  Resp: 16 15  Temp: 36.9 C 36.9 C  SpO2: 97% 96%    Last Pain:  Vitals:   07/02/17 0815  TempSrc:   PainSc: 2                  Hayleigh Bawa EDWARD

## 2017-07-02 NOTE — Progress Notes (Signed)
Received referral to assist pt with HHPT. Met with pt at bedside. She plans to return home with the support of her niece. She has a RW and a 3-in-1 BSC. She prefers to use [facility] for HHPT. She stated that she used them before. Contacted Jermaine at [facility] for referral.

## 2017-07-02 NOTE — Discharge Instructions (Signed)
Home Health PT by [facility]  INSTRUCTIONS AFTER JOINT REPLACEMENT   o Remove items at home which could result in a fall. This includes throw rugs or furniture in walking pathways o ICE to the affected joint every three hours while awake for 30 minutes at a time, for at least the first 3-5 days, and then as needed for pain and swelling.  Continue to use ice for pain and swelling. You may notice swelling that will progress down to the foot and ankle.  This is normal after surgery.  Elevate your leg when you are not up walking on it.   o Continue to use the breathing machine you got in the hospital (incentive spirometer) which will help keep your temperature down.  It is common for your temperature to cycle up and down following surgery, especially at night when you are not up moving around and exerting yourself.  The breathing machine keeps your lungs expanded and your temperature down.   DIET:  As you were doing prior to hospitalization, we recommend a well-balanced diet.  DRESSING / WOUND CARE / SHOWERING  Keep the surgical dressing until follow up.  The dressing is water proof, so you can shower without any extra covering.  IF THE DRESSING FALLS OFF or the wound gets wet inside, change the dressing with sterile gauze.  Please use good hand washing techniques before changing the dressing.  Do not use any lotions or creams on the incision until instructed by your surgeon.    ACTIVITY  o Increase activity slowly as tolerated, but follow the weight bearing instructions below.   o No driving for 6 weeks or until further direction given by your physician.  You cannot drive while taking narcotics.  o No lifting or carrying greater than 10 lbs. until further directed by your surgeon. o Avoid periods of inactivity such as sitting longer than an hour when not asleep. This helps prevent blood clots.  o You may return to work once you are authorized by your doctor.     WEIGHT  BEARING   Weight bearing as tolerated with assist device (walker, cane, etc) as directed, use it as long as suggested by your surgeon or therapist, typically at least 4-6 weeks.   EXERCISES  Results after joint replacement surgery are often greatly improved when you follow the exercise, range of motion and muscle strengthening exercises prescribed by your doctor. Safety measures are also important to protect the joint from further injury. Any time any of these exercises cause you to have increased pain or swelling, decrease what you are doing until you are comfortable again and then slowly increase them. If you have problems or questions, call your caregiver or physical therapist for advice.   Rehabilitation is important following a joint replacement. After just a few days of immobilization, the muscles of the leg can become weakened and shrink (atrophy).  These exercises are designed to build up the tone and strength of the thigh and leg muscles and to improve motion. Often times heat used for twenty to thirty minutes before working out will loosen up your tissues and help with improving the range of motion but do not use heat for the first two weeks following surgery (sometimes heat can increase post-operative swelling).   These exercises can be done on a training (exercise) mat, on the floor, on a table or on a bed. Use whatever works the best and is most comfortable for you.    Use  music or television while you are exercising so that the exercises are a pleasant break in your day. This will make your life better with the exercises acting as a break in your routine that you can look forward to.   Perform all exercises about fifteen times, three times per day or as directed.  You should exercise both the operative leg and the other leg as well.  Exercises include:    Quad Sets - Tighten up the muscle on the front of the thigh (Quad) and hold for 5-10 seconds.    Straight Leg Raises - With your  knee straight (if you were given a brace, keep it on), lift the leg to 60 degrees, hold for 3 seconds, and slowly lower the leg.  Perform this exercise against resistance later as your leg gets stronger.   Leg Slides: Lying on your back, slowly slide your foot toward your buttocks, bending your knee up off the floor (only go as far as is comfortable). Then slowly slide your foot back down until your leg is flat on the floor again.   Angel Wings: Lying on your back spread your legs to the side as far apart as you can without causing discomfort.   Hamstring Strength:  Lying on your back, push your heel against the floor with your leg straight by tightening up the muscles of your buttocks.  Repeat, but this time bend your knee to a comfortable angle, and push your heel against the floor.  You may put a pillow under the heel to make it more comfortable if necessary.   A rehabilitation program following joint replacement surgery can speed recovery and prevent re-injury in the future due to weakened muscles. Contact your doctor or a physical therapist for more information on knee rehabilitation.    CONSTIPATION  Constipation is defined medically as fewer than three stools per week and severe constipation as less than one stool per week.  Even if you have a regular bowel pattern at home, your normal regimen is likely to be disrupted due to multiple reasons following surgery.  Combination of anesthesia, postoperative narcotics, change in appetite and fluid intake all can affect your bowels.   YOU MUST use at least one of the following options; they are listed in order of increasing strength to get the job done.  They are all available over the counter, and you may need to use some, POSSIBLY even all of these options:    Drink plenty of fluids (prune juice may be helpful) and high fiber foods Colace 100 mg by mouth twice a day  Senokot for constipation as directed and as needed Dulcolax (bisacodyl), take  with full glass of water  Miralax (polyethylene glycol) once or twice a day as needed.  If you have tried all these things and are unable to have a bowel movement in the first 3-4 days after surgery call either your surgeon or your primary doctor.    If you experience loose stools or diarrhea, hold the medications until you stool forms back up.  If your symptoms do not get better within 1 week or if they get worse, check with your doctor.  If you experience "the worst abdominal pain ever" or develop nausea or vomiting, please contact the office immediately for further recommendations for treatment.   ITCHING:  If you experience itching with your medications, try taking only a single pain pill, or even half a pain pill at a time.  You can also use  Benadryl over the counter for itching or also to help with sleep.   TED HOSE STOCKINGS:  Use stockings on both legs until for at least 2 weeks or as directed by physician office. They may be removed at night for sleeping.  MEDICATIONS:  See your medication summary on the After Visit Summary that nursing will review with you.  You may have some home medications which will be placed on hold until you complete the course of blood thinner medication.  It is important for you to complete the blood thinner medication as prescribed.  PRECAUTIONS:  If you experience chest pain or shortness of breath - call 911 immediately for transfer to the hospital emergency department.   If you develop a fever greater that 101 F, purulent drainage from wound, increased redness or drainage from wound, foul odor from the wound/dressing, or calf pain - CONTACT YOUR SURGEON.                                                   FOLLOW-UP APPOINTMENTS:  If you do not already have a post-op appointment, please call the office for an appointment to be seen by your surgeon.  Guidelines for how soon to be seen are listed in your After Visit Summary, but are typically between 1-4 weeks  after surgery.  OTHER INSTRUCTIONS:   Knee Replacement:  Do not place pillow under knee, focus on keeping the knee straight while resting. CPM instructions: 0-90 degrees, 2 hours in the morning, 2 hours in the afternoon, and 2 hours in the evening. Place foam block, curve side up under heel at all times except when in CPM or when walking.  DO NOT modify, tear, cut, or change the foam block in any way.  MAKE SURE YOU:   Understand these instructions.   Get help right away if you are not doing well or get worse.    Thank you for letting us be a part of your medical care team.  It is a privilege we respect greatly.  We hope these instructions will help you stay on track for a fast and full recovery!

## 2017-07-02 NOTE — Progress Notes (Signed)
Discharged from floor via w/c for transport home by car. Belongings & family with pt. No changes in assessment. Kalila Adkison  

## 2017-07-02 NOTE — Progress Notes (Signed)
Physical Therapy Treatment Patient Details Name: Sherri Delgado MRN: [id number] DOB: [birth year] Today's Date: 07/02/2017    History of Present Illness 71 yo female s/p R TKA 06/30/17. Hx of L TKA 08/2016    PT Comments    Pt making good progress with mobility.  She did become nauseous and threw up at end of session after gait.  From PT mobility standpoint she is moving well enough for d/c home with family and neighbors checking on her and HHPT.  Follow Up Recommendations  Home health PT;Supervision - Intermittent     Equipment Recommendations  None recommended by PT    Recommendations for Other Services       Precautions / Restrictions Precautions Precautions: Fall;Knee Precaution Booklet Issued: Yes (comment) Required Braces or Orthoses: Knee Immobilizer - Right(d/c, pt able to do SLR) Restrictions Weight Bearing Restrictions: No RLE Weight Bearing: Weight bearing as tolerated    Mobility  Bed Mobility Overal bed mobility: Independent Bed Mobility: Supine to Sit;Sit to Supine     Supine to sit: Independent Sit to supine: Independent   General bed mobility comments: no rails, bed flat  Transfers Overall transfer level: Needs assistance Equipment used: Rolling walker (2 wheeled) Transfers: Sit to/from Stand Sit to Stand: Supervision            Ambulation/Gait Ambulation/Gait assistance: Supervision Ambulation Distance (Feet): 175 Feet Assistive device: Rolling walker (2 wheeled) Gait Pattern/deviations: Decreased step length - right;Decreased step length - left;Decreased stance time - right Gait velocity: decreased   General Gait Details: S with slow cadence.  Heel toe pattern improved as gait progressed   Stairs            Wheelchair Mobility    Modified Rankin (Stroke Patients Only)       Balance                                            Cognition Arousal/Alertness: Awake/alert Behavior During Therapy: WFL for tasks  assessed/performed Overall Cognitive Status: Within Functional Limits for tasks assessed                                        Exercises Total Joint Exercises Ankle Circles/Pumps: AROM;Both;10 reps;Supine Quad Sets: AROM;10 reps;Right;Supine Heel Slides: AROM;Right;10 reps;Supine Straight Leg Raises: AROM;Right;5 reps;Supine Goniometric ROM: grossly 40 degrees flexion close to neutral with quad set    General Comments        Pertinent Vitals/Pain Pain Assessment: 0-10 Pain Score: 5  Pain Location: R knee Pain Descriptors / Indicators: Aching;Sore Pain Intervention(s): Monitored during session;Repositioned;Ice applied    Home Living                      Prior Function            PT Goals (current goals can now be found in the care plan section) Acute Rehab PT Goals PT Goal Formulation: With patient Time For Goal Achievement: 07/15/17 Potential to Achieve Goals: Good Progress towards PT goals: Progressing toward goals    Frequency    7X/week      PT Plan Current plan remains appropriate    Co-evaluation              AM-PAC PT "6 Clicks" Daily Activity  Outcome  Measure  Difficulty turning over in bed (including adjusting bedclothes, sheets and blankets)?: None Difficulty moving from lying on back to sitting on the side of the bed? : None Difficulty sitting down on and standing up from a chair with arms (e.g., wheelchair, bedside commode, etc,.)?: A Little Help needed moving to and from a bed to chair (including a wheelchair)?: A Little Help needed walking in hospital room?: A Little Help needed climbing 3-5 steps with a railing? : A Little 6 Click Score: 20    End of Session Equipment Utilized During Treatment: Gait belt Activity Tolerance: Other (comment)(vomited at end of gait session) Patient left: in bed;with call bell/phone within reach Nurse Communication: Mobility status;Other (comment)(nausea) PT Visit Diagnosis:  Pain;Difficulty in walking, not elsewhere classified (R26.2) Pain - Right/Left: Right Pain - part of body: Knee     Time: 1610-9604 PT Time Calculation (min) (ACUTE ONLY): 32 min  Charges:  $Gait Training: 8-22 mins $Therapeutic Exercise: 8-22 mins                    G Codes:       Swanson Farnell L. Tamala Julian, [place] Pager [phone] 07/02/2017    Galen Manila 07/02/2017, 9:37 AM

## 2017-07-02 NOTE — Discharge Summary (Signed)
Patient ID: Sherri Delgado MRN: [id number] DOB/AGE: 01/17/1946 71 y.o.  Admit date: 06/30/2017 Discharge date: 07/02/2017  Admission Diagnoses:  Principal Problem:   Unilateral primary osteoarthritis, right knee Active Problems:   Status post total right knee replacement   Discharge Diagnoses:  Same  Past Medical History:  Diagnosis Date  . Arthritis   . Cancer ([place])    cervical in her 40's  . Depression    Hx of with divorce  . History of cervical cancer    s/p hysterectomyy. Most recent pap in 2009 normal  . Hyperlipidemia   . Hypertension   . Obesity   . Osteopenia    DEXA 08/2003, T score lumbar -1.9    Surgeries: Procedure(s): RIGHT TOTAL KNEE ARTHROPLASTY on 06/30/2017   Consultants:   Discharged Condition: Improved  Hospital Course: Sherri Delgado is an 71 y.o. female who was admitted 06/30/2017 for operative treatment ofUnilateral primary osteoarthritis, right knee. Patient has severe unremitting pain that affects sleep, daily activities, and work/hobbies. After pre-op clearance the patient was taken to the operating room on 06/30/2017 and underwent  Procedure(s): RIGHT TOTAL KNEE ARTHROPLASTY.    Patient was given perioperative antibiotics:  Anti-infectives (From admission, onward)   Start     Dose/Rate Route Frequency Ordered Stop   06/30/17 2200  ceFAZolin (ANCEF) IVPB 1 g/50 mL premix     1 g 100 mL/hr over 30 Minutes Intravenous Every 6 hours 06/30/17 1945 07/01/17 0419   06/30/17 1206  ceFAZolin (ANCEF) 2-4 GM/100ML-% IVPB    Comments:  Harvell, Gwendolyn  : cabinet override      06/30/17 1206 07/01/17 0014   06/30/17 1151  ceFAZolin (ANCEF) IVPB 2g/100 mL premix  Status:  Discontinued     2 g 200 mL/hr over 30 Minutes Intravenous On call to O.R. 06/30/17 1151 06/30/17 1925       Patient was given sequential compression devices, early ambulation, and chemoprophylaxis to prevent DVT.  Patient benefited maximally from hospital stay and there were no  complications.    Recent vital signs:  Patient Vitals for the past 24 hrs:  BP Temp Temp src Pulse Resp SpO2  07/02/17 1011 (!) 141/70 - - 70 - -  07/02/17 0543 115/62 98.5 F (36.9 C) Oral 76 15 96 %  07/01/17 2212 (!) 102/58 98.5 F (36.9 C) Oral 67 16 97 %     Recent laboratory studies:  Recent Labs    07/01/17 0554  WBC 10.0  HGB 11.2*  HCT 32.5*  PLT 213  NA 139  K 3.9  CL 103  CO2 27  BUN 12  CREATININE 0.78  GLUCOSE 160*  CALCIUM 9.2     Discharge Medications:   Allergies as of 07/02/2017   No Known Allergies     Medication List    TAKE these medications   amLODipine 10 MG tablet Commonly known as:  NORVASC Take 1 tablet (10 mg total) by mouth daily.   aspirin 325 MG EC tablet Take 1 tablet (325 mg total) by mouth 2 (two) times daily after a meal.   atorvastatin 40 MG tablet Commonly known as:  LIPITOR Take 40 mg by mouth every morning.   diphenhydrAMINE 25 mg capsule Commonly known as:  BENADRYL Take 75 mg by mouth at bedtime.   hydrochlorothiazide 25 MG tablet Commonly known as:  HYDRODIURIL Take 1 tablet (25 mg total) by mouth daily.   HYDROcodone-acetaminophen 5-325 MG tablet Commonly known as:  NORCO Take 1-2 tablets by mouth every  4 (four) hours as needed for moderate pain.   lisinopril 40 MG tablet Commonly known as:  PRINIVIL,ZESTRIL Take 1 tablet (40 mg total) by mouth daily.   methocarbamol 500 MG tablet Commonly known as:  ROBAXIN Take 1 tablet (500 mg total) by mouth every 6 (six) hours as needed for muscle spasms.            Durable Medical Equipment  (From admission, onward)        Start     Ordered   06/30/17 1946  DME 3 n 1  Once     06/30/17 1945   06/30/17 1946  DME Walker rolling  Once    Question:  Patient needs a walker to treat with the following condition  Answer:  Status post total right knee replacement   06/30/17 1945      Diagnostic Studies: Dg Knee Right Port  Result Date:  06/30/2017 CLINICAL DATA:  Post op knee replacement EXAM: PORTABLE RIGHT KNEE - 1-2 VIEW COMPARISON:  02/11/2016 FINDINGS: Status post right knee replacement with normal alignment. No fracture. Gas in the joint space and soft tissues. IMPRESSION: Status post right knee replacement with expected postsurgical change Electronically Signed   By: Donavan Foil M.D.   On: 06/30/2017 20:32    Disposition: 06-Home-Health Care Svc    Follow-up Information    Mcarthur Rossetti, MD. Schedule an appointment as soon as possible for a visit in 2 week(s).   Specialty:  Orthopedic Surgery Contact information: [place] [address] [phone]            Signed: Aundra Dubin 07/02/2017, 11:42 AM

## 2017-07-03 ENCOUNTER — Encounter (HOSPITAL_COMMUNITY): Payer: Self-pay | Admitting: Orthopaedic Surgery

## 2017-07-03 NOTE — Op Note (Signed)
[name], NUNCIO NO.:  [id number]  MEDICAL RECORD NO.:  [id number]  LOCATION:                                 FACILITY:  PHYSICIAN:  Lind Guest. Ninfa Linden, M.D.DATE OF BIRTH:  DATE OF PROCEDURE:  06/30/2017 DATE OF DISCHARGE:                              OPERATIVE REPORT   PREOPERATIVE DIAGNOSIS:  Primary osteoarthritis and degenerative joint disease with varus malalignment, right knee.  POSTOPERATIVE DIAGNOSIS:  Primary osteoarthritis and degenerative joint disease with varus malalignment, right knee.  PROCEDURE:  Right total knee arthroplasty.  IMPLANTS:  Stryker Triathlon cemented knee system with size 3 femur, size 3 tibial tray, 11 mm fix-bearing polyethylene insert, size 32 patellar button.  SURGEON:  Lind Guest. Ninfa Linden, M.D.  ANESTHESIA: 1. Right lower extremity adductor canal block. 2. Spinal.  ANTIBIOTICS:  2 g of IV Ancef.  TOURNIQUET TIME:  Under 1 hour.  BLOOD LOSS:  Less than 100 mL.  COMPLICATIONS:  None.  INDICATIONS:  Sherri Delgado is a very pleasant 71 year old female with debilitating arthritis of both of her knees.  We performed a left total knee arthroplasty earlier in the year, now she presents for right total knee arthroplasty.  She understands the risk of acute blood loss anemia, nerve and vessel injury, fracture, infection, DVT.  She understands her goals are to decrease pain, improve mobility, and overall improve quality of life.  Her x-rays show significant varus malalignment of her knee with osteophytes in all 3 compartments and complete loss of the medial joint line.  Clinically, she does lack full extension by about 5 degrees and I can only flex to about 95 degrees.  PROCEDURE DESCRIPTION:  After informed consent was obtained, appropriate right knee was marked.  Anesthesia was obtained with adductor canal block in the holding room.  She was brought to the operating room and placed supine on the operating  table.  She was then sat up and spinal anesthesia was obtained.  She was laid back supine.  A Foley catheter was placed and then a nonsterile tourniquet was placed around her upper right thigh.  Her right leg was then prepped and draped from the thigh down the toes with DuraPrep and sterile drapes including the sterile stockinette.  Time-out was called and she was identified as correct patient and correct right knee.  I then used Esmarch to wrap out the leg and tourniquet was inflated to 300 mm of pressure.  I then made a direct midline incision over the patella and carried this proximally and distally.  I dissected down the knee joint, carried out a medial parapatellar arthrotomy, finding a very large joint effusion and significant arthritis throughout her knee.  With the knee in a flexed position, I removed the ACL, PCL, and remnants of the medial and lateral meniscus.  I set my extramedullary cutting guide for the tibia to take 9 mm off the high side, correcting for varus, valgus, and neutral slope. We made this cut without difficulty.  We then went for an intramedullary guide for the femur through the notch, making our distal femoral cut. We set our distal femoral cutting guide for an 8 mm distal femoral cut, setting  this for right knee at 5 degrees externally rotated.  We made this cut without difficulty.  We brought the knee back down to full extension with a 9 mm extension block and achieved full extension.  I then went back to the femur and put our femoral sizing guide based off Whiteside's line and the epicondylar axis and chose a size 3 femur based off this.  We put our 4-in-1 cutting block for a size 3 femur, made our anterior and posterior cuts followed by our chamfer cuts.  I then made my femoral box cut.  I then went back to the tibia and chose a size 3 tibial tray for coverage of the tibia and set the rotation off the tibial tubercle and the femur.  We made our keel cut off  this and with the size 3 tibial tray and the 3 femoral trial, we trialed a 9 and then an 11 mm fix-bearing polyethylene insert.  I was pleased with stability with 11 mm insert and was still getting range of motion.  We then made our patellar cut and drilled 3 holes for size 32 patellar button.  We then removed all trial instrumentation, irrigated the knee with normal saline solution using pulsatile lavage.  We then mixed our cement and we cemented the real Stryker Triathlon tibial tray size 3 followed by the real size 3 femur.  We removed cement debris from the knee and then placed our 11 mm fix-bearing polyethylene insert and cemented our patellar button.  Once the cement had hardened, we let the tourniquet down and hemostasis was achieved with electrocautery.  We then closed the arthrotomy with interrupted #1 Vicryl suture followed by 0 Vicryl in the deep tissue, 2-0 Vicryl in the subcutaneous tissue, interrupted staples on the skin.  Xeroform and well-padded sterile dressings were applied.  She was taken to the recovery room in stable condition.  All final counts were correct.  There were no complications noted.     Lind Guest. Ninfa Linden, M.D.   ______________________________ Lind Guest. Ninfa Linden, M.D.    CYB/MEDQ  D:  06/30/2017  T:  07/01/2017  Job:  [id number]

## 2017-07-05 DIAGNOSIS — E669 Obesity, unspecified: Secondary | ICD-10-CM | POA: Diagnosis not present

## 2017-07-05 DIAGNOSIS — Z96651 Presence of right artificial knee joint: Secondary | ICD-10-CM | POA: Diagnosis not present

## 2017-07-05 DIAGNOSIS — Z7982 Long term (current) use of aspirin: Secondary | ICD-10-CM | POA: Diagnosis not present

## 2017-07-05 DIAGNOSIS — E785 Hyperlipidemia, unspecified: Secondary | ICD-10-CM | POA: Diagnosis not present

## 2017-07-05 DIAGNOSIS — M170 Bilateral primary osteoarthritis of knee: Secondary | ICD-10-CM | POA: Diagnosis not present

## 2017-07-05 DIAGNOSIS — Z8541 Personal history of malignant neoplasm of cervix uteri: Secondary | ICD-10-CM | POA: Diagnosis not present

## 2017-07-05 DIAGNOSIS — I10 Essential (primary) hypertension: Secondary | ICD-10-CM | POA: Diagnosis not present

## 2017-07-05 DIAGNOSIS — Z96652 Presence of left artificial knee joint: Secondary | ICD-10-CM | POA: Diagnosis not present

## 2017-07-05 DIAGNOSIS — Z471 Aftercare following joint replacement surgery: Secondary | ICD-10-CM | POA: Diagnosis not present

## 2017-07-05 DIAGNOSIS — D649 Anemia, unspecified: Secondary | ICD-10-CM | POA: Diagnosis not present

## 2017-07-06 ENCOUNTER — Encounter (HOSPITAL_COMMUNITY): Payer: Self-pay | Admitting: Orthopaedic Surgery

## 2017-07-07 DIAGNOSIS — M170 Bilateral primary osteoarthritis of knee: Secondary | ICD-10-CM | POA: Diagnosis not present

## 2017-07-07 DIAGNOSIS — Z96652 Presence of left artificial knee joint: Secondary | ICD-10-CM | POA: Diagnosis not present

## 2017-07-07 DIAGNOSIS — Z7982 Long term (current) use of aspirin: Secondary | ICD-10-CM | POA: Diagnosis not present

## 2017-07-07 DIAGNOSIS — Z471 Aftercare following joint replacement surgery: Secondary | ICD-10-CM | POA: Diagnosis not present

## 2017-07-07 DIAGNOSIS — D649 Anemia, unspecified: Secondary | ICD-10-CM | POA: Diagnosis not present

## 2017-07-07 DIAGNOSIS — Z8541 Personal history of malignant neoplasm of cervix uteri: Secondary | ICD-10-CM | POA: Diagnosis not present

## 2017-07-07 DIAGNOSIS — E669 Obesity, unspecified: Secondary | ICD-10-CM | POA: Diagnosis not present

## 2017-07-07 DIAGNOSIS — I10 Essential (primary) hypertension: Secondary | ICD-10-CM | POA: Diagnosis not present

## 2017-07-07 DIAGNOSIS — E785 Hyperlipidemia, unspecified: Secondary | ICD-10-CM | POA: Diagnosis not present

## 2017-07-07 DIAGNOSIS — Z96651 Presence of right artificial knee joint: Secondary | ICD-10-CM | POA: Diagnosis not present

## 2017-07-10 DIAGNOSIS — M170 Bilateral primary osteoarthritis of knee: Secondary | ICD-10-CM | POA: Diagnosis not present

## 2017-07-10 DIAGNOSIS — Z96651 Presence of right artificial knee joint: Secondary | ICD-10-CM | POA: Diagnosis not present

## 2017-07-10 DIAGNOSIS — E669 Obesity, unspecified: Secondary | ICD-10-CM | POA: Diagnosis not present

## 2017-07-10 DIAGNOSIS — E785 Hyperlipidemia, unspecified: Secondary | ICD-10-CM | POA: Diagnosis not present

## 2017-07-10 DIAGNOSIS — I10 Essential (primary) hypertension: Secondary | ICD-10-CM | POA: Diagnosis not present

## 2017-07-10 DIAGNOSIS — Z471 Aftercare following joint replacement surgery: Secondary | ICD-10-CM | POA: Diagnosis not present

## 2017-07-10 DIAGNOSIS — Z7982 Long term (current) use of aspirin: Secondary | ICD-10-CM | POA: Diagnosis not present

## 2017-07-10 DIAGNOSIS — D649 Anemia, unspecified: Secondary | ICD-10-CM | POA: Diagnosis not present

## 2017-07-10 DIAGNOSIS — Z8541 Personal history of malignant neoplasm of cervix uteri: Secondary | ICD-10-CM | POA: Diagnosis not present

## 2017-07-10 DIAGNOSIS — Z96652 Presence of left artificial knee joint: Secondary | ICD-10-CM | POA: Diagnosis not present

## 2017-07-12 DIAGNOSIS — Z7982 Long term (current) use of aspirin: Secondary | ICD-10-CM | POA: Diagnosis not present

## 2017-07-12 DIAGNOSIS — Z96651 Presence of right artificial knee joint: Secondary | ICD-10-CM | POA: Diagnosis not present

## 2017-07-12 DIAGNOSIS — Z471 Aftercare following joint replacement surgery: Secondary | ICD-10-CM | POA: Diagnosis not present

## 2017-07-12 DIAGNOSIS — I10 Essential (primary) hypertension: Secondary | ICD-10-CM | POA: Diagnosis not present

## 2017-07-12 DIAGNOSIS — Z8541 Personal history of malignant neoplasm of cervix uteri: Secondary | ICD-10-CM | POA: Diagnosis not present

## 2017-07-12 DIAGNOSIS — D649 Anemia, unspecified: Secondary | ICD-10-CM | POA: Diagnosis not present

## 2017-07-12 DIAGNOSIS — E669 Obesity, unspecified: Secondary | ICD-10-CM | POA: Diagnosis not present

## 2017-07-12 DIAGNOSIS — E785 Hyperlipidemia, unspecified: Secondary | ICD-10-CM | POA: Diagnosis not present

## 2017-07-12 DIAGNOSIS — M170 Bilateral primary osteoarthritis of knee: Secondary | ICD-10-CM | POA: Diagnosis not present

## 2017-07-12 DIAGNOSIS — Z96652 Presence of left artificial knee joint: Secondary | ICD-10-CM | POA: Diagnosis not present

## 2017-07-13 ENCOUNTER — Telehealth (INDEPENDENT_AMBULATORY_CARE_PROVIDER_SITE_OTHER): Payer: Self-pay | Admitting: Orthopaedic Surgery

## 2017-07-13 ENCOUNTER — Ambulatory Visit (INDEPENDENT_AMBULATORY_CARE_PROVIDER_SITE_OTHER): Payer: Medicare HMO | Admitting: Orthopaedic Surgery

## 2017-07-13 ENCOUNTER — Encounter (INDEPENDENT_AMBULATORY_CARE_PROVIDER_SITE_OTHER): Payer: Self-pay | Admitting: Orthopaedic Surgery

## 2017-07-13 DIAGNOSIS — Z96651 Presence of right artificial knee joint: Secondary | ICD-10-CM

## 2017-07-13 MED ORDER — HYDROCODONE-ACETAMINOPHEN 5-325 MG PO TABS: 1.0000 | ORAL_TABLET | Freq: Four times a day (QID) | ORAL | 0 refills | Status: DC | PRN

## 2017-07-13 MED ORDER — METHOCARBAMOL 500 MG PO TABS: 500.0000 mg | ORAL_TABLET | Freq: Four times a day (QID) | ORAL | 0 refills | Status: DC | PRN

## 2017-07-13 NOTE — Telephone Encounter (Signed)
[facility] needs verbal orders for patient. States she sent a fax but hasnt heard anything.  2-3 x a week for 3 weeks (this would have started 07/05/2017)  CB # [phone]

## 2017-07-13 NOTE — Telephone Encounter (Signed)
Verbal order given on VM 

## 2017-07-13 NOTE — Progress Notes (Signed)
The patient is now 2 weeks status post a right total knee arthroplasty.  She is doing well overall.  Is having home therapy.  He says she can just go to the [facility] after this to exercise her knee and does not need outpatient therapy.  Well overall.  She has been on a full strength aspirin twice a day.  She does need a refill of her hydrocodone and Robaxin.  On exam her calf is soft.  He has almost full extension to 90 degrees flexion.  The staples are removed and Steri-Strips applied.  There is no complicating features thus far.  At this point she is ambling with a walker.  She still needs to hold off working or driving for several more weeks.  I will see her back in a month to see how she is doing overall but no x-rays are needed.  All questions concerns were answered and addressed.

## 2017-07-14 DIAGNOSIS — Z471 Aftercare following joint replacement surgery: Secondary | ICD-10-CM | POA: Diagnosis not present

## 2017-07-14 DIAGNOSIS — Z8541 Personal history of malignant neoplasm of cervix uteri: Secondary | ICD-10-CM | POA: Diagnosis not present

## 2017-07-14 DIAGNOSIS — Z96651 Presence of right artificial knee joint: Secondary | ICD-10-CM | POA: Diagnosis not present

## 2017-07-14 DIAGNOSIS — M170 Bilateral primary osteoarthritis of knee: Secondary | ICD-10-CM | POA: Diagnosis not present

## 2017-07-14 DIAGNOSIS — D649 Anemia, unspecified: Secondary | ICD-10-CM | POA: Diagnosis not present

## 2017-07-14 DIAGNOSIS — Z96652 Presence of left artificial knee joint: Secondary | ICD-10-CM | POA: Diagnosis not present

## 2017-07-14 DIAGNOSIS — Z7982 Long term (current) use of aspirin: Secondary | ICD-10-CM | POA: Diagnosis not present

## 2017-07-14 DIAGNOSIS — I10 Essential (primary) hypertension: Secondary | ICD-10-CM | POA: Diagnosis not present

## 2017-07-14 DIAGNOSIS — E785 Hyperlipidemia, unspecified: Secondary | ICD-10-CM | POA: Diagnosis not present

## 2017-07-14 DIAGNOSIS — E669 Obesity, unspecified: Secondary | ICD-10-CM | POA: Diagnosis not present

## 2017-07-17 DIAGNOSIS — Z7982 Long term (current) use of aspirin: Secondary | ICD-10-CM | POA: Diagnosis not present

## 2017-07-17 DIAGNOSIS — D649 Anemia, unspecified: Secondary | ICD-10-CM | POA: Diagnosis not present

## 2017-07-17 DIAGNOSIS — E785 Hyperlipidemia, unspecified: Secondary | ICD-10-CM | POA: Diagnosis not present

## 2017-07-17 DIAGNOSIS — Z96651 Presence of right artificial knee joint: Secondary | ICD-10-CM | POA: Diagnosis not present

## 2017-07-17 DIAGNOSIS — Z8541 Personal history of malignant neoplasm of cervix uteri: Secondary | ICD-10-CM | POA: Diagnosis not present

## 2017-07-17 DIAGNOSIS — E669 Obesity, unspecified: Secondary | ICD-10-CM | POA: Diagnosis not present

## 2017-07-17 DIAGNOSIS — Z96652 Presence of left artificial knee joint: Secondary | ICD-10-CM | POA: Diagnosis not present

## 2017-07-17 DIAGNOSIS — M170 Bilateral primary osteoarthritis of knee: Secondary | ICD-10-CM | POA: Diagnosis not present

## 2017-07-17 DIAGNOSIS — I10 Essential (primary) hypertension: Secondary | ICD-10-CM | POA: Diagnosis not present

## 2017-07-17 DIAGNOSIS — Z471 Aftercare following joint replacement surgery: Secondary | ICD-10-CM | POA: Diagnosis not present

## 2017-07-21 DIAGNOSIS — I10 Essential (primary) hypertension: Secondary | ICD-10-CM | POA: Diagnosis not present

## 2017-07-21 DIAGNOSIS — Z96651 Presence of right artificial knee joint: Secondary | ICD-10-CM | POA: Diagnosis not present

## 2017-07-21 DIAGNOSIS — E785 Hyperlipidemia, unspecified: Secondary | ICD-10-CM | POA: Diagnosis not present

## 2017-07-21 DIAGNOSIS — Z8541 Personal history of malignant neoplasm of cervix uteri: Secondary | ICD-10-CM | POA: Diagnosis not present

## 2017-07-21 DIAGNOSIS — Z471 Aftercare following joint replacement surgery: Secondary | ICD-10-CM | POA: Diagnosis not present

## 2017-07-21 DIAGNOSIS — D649 Anemia, unspecified: Secondary | ICD-10-CM | POA: Diagnosis not present

## 2017-07-21 DIAGNOSIS — E669 Obesity, unspecified: Secondary | ICD-10-CM | POA: Diagnosis not present

## 2017-07-21 DIAGNOSIS — Z7982 Long term (current) use of aspirin: Secondary | ICD-10-CM | POA: Diagnosis not present

## 2017-07-21 DIAGNOSIS — Z96652 Presence of left artificial knee joint: Secondary | ICD-10-CM | POA: Diagnosis not present

## 2017-07-21 DIAGNOSIS — M170 Bilateral primary osteoarthritis of knee: Secondary | ICD-10-CM | POA: Diagnosis not present

## 2017-08-14 ENCOUNTER — Ambulatory Visit (INDEPENDENT_AMBULATORY_CARE_PROVIDER_SITE_OTHER): Payer: Medicare HMO | Admitting: Orthopaedic Surgery

## 2017-08-14 ENCOUNTER — Encounter (INDEPENDENT_AMBULATORY_CARE_PROVIDER_SITE_OTHER): Payer: Self-pay | Admitting: Orthopaedic Surgery

## 2017-08-14 DIAGNOSIS — Z96651 Presence of right artificial knee joint: Secondary | ICD-10-CM

## 2017-08-14 NOTE — Progress Notes (Signed)
The patient is now 6 weeks status post a right total knee arthroplasty in 12 months status post a left total knee arthroplasty.  She says she is doing great and has good range of motion strength is were ready to return to work today.  Examination of her left knee does show full range of motion.  Examination of her right most recent operative knee shows full extension to only about 90 degrees flexion.  She is been doing all this on her own without physical therapy.  She still feels like she can do it on her own without any physical therapy.  She does want a note allowing her to return to full work duties today without restrictions.  The knee does feel ligaments is stable and there is moderate swelling to be expected. overall though things do look well.  At this point I do not really need to see her back for 6 months.  We will have an AP and lateral both knees at that visit.

## 2017-08-25 DIAGNOSIS — Z8249 Family history of ischemic heart disease and other diseases of the circulatory system: Secondary | ICD-10-CM | POA: Diagnosis not present

## 2017-08-25 DIAGNOSIS — Z823 Family history of stroke: Secondary | ICD-10-CM | POA: Diagnosis not present

## 2017-08-25 DIAGNOSIS — G4700 Insomnia, unspecified: Secondary | ICD-10-CM | POA: Diagnosis not present

## 2017-08-25 DIAGNOSIS — I10 Essential (primary) hypertension: Secondary | ICD-10-CM | POA: Diagnosis not present

## 2017-08-25 DIAGNOSIS — Z809 Family history of malignant neoplasm, unspecified: Secondary | ICD-10-CM | POA: Diagnosis not present

## 2017-08-25 DIAGNOSIS — R69 Illness, unspecified: Secondary | ICD-10-CM | POA: Diagnosis not present

## 2017-08-25 DIAGNOSIS — Z833 Family history of diabetes mellitus: Secondary | ICD-10-CM | POA: Diagnosis not present

## 2017-08-25 DIAGNOSIS — Z6835 Body mass index (BMI) 35.0-35.9, adult: Secondary | ICD-10-CM | POA: Diagnosis not present

## 2017-08-25 DIAGNOSIS — K08109 Complete loss of teeth, unspecified cause, unspecified class: Secondary | ICD-10-CM | POA: Diagnosis not present

## 2018-01-17 ENCOUNTER — Ambulatory Visit (INDEPENDENT_AMBULATORY_CARE_PROVIDER_SITE_OTHER): Payer: Medicare HMO | Admitting: Internal Medicine

## 2018-01-17 ENCOUNTER — Encounter (INDEPENDENT_AMBULATORY_CARE_PROVIDER_SITE_OTHER): Payer: Self-pay

## 2018-01-17 ENCOUNTER — Other Ambulatory Visit: Payer: Self-pay

## 2018-01-17 ENCOUNTER — Encounter: Payer: Self-pay | Admitting: Internal Medicine

## 2018-01-17 VITALS — BP 135/75 | HR 70 | Temp 98.6°F | Ht 65.0 in | Wt 214.0 lb

## 2018-01-17 DIAGNOSIS — N3941 Urge incontinence: Secondary | ICD-10-CM

## 2018-01-17 DIAGNOSIS — R600 Localized edema: Secondary | ICD-10-CM

## 2018-01-17 DIAGNOSIS — Z0000 Encounter for general adult medical examination without abnormal findings: Secondary | ICD-10-CM | POA: Diagnosis not present

## 2018-01-17 DIAGNOSIS — R058 Other specified cough: Secondary | ICD-10-CM | POA: Insufficient documentation

## 2018-01-17 DIAGNOSIS — Z8541 Personal history of malignant neoplasm of cervix uteri: Secondary | ICD-10-CM | POA: Diagnosis not present

## 2018-01-17 DIAGNOSIS — M8580 Other specified disorders of bone density and structure, unspecified site: Secondary | ICD-10-CM

## 2018-01-17 DIAGNOSIS — E785 Hyperlipidemia, unspecified: Secondary | ICD-10-CM

## 2018-01-17 DIAGNOSIS — M8588 Other specified disorders of bone density and structure, other site: Secondary | ICD-10-CM

## 2018-01-17 DIAGNOSIS — Z90710 Acquired absence of both cervix and uterus: Secondary | ICD-10-CM | POA: Diagnosis not present

## 2018-01-17 DIAGNOSIS — Z79899 Other long term (current) drug therapy: Secondary | ICD-10-CM

## 2018-01-17 DIAGNOSIS — I10 Essential (primary) hypertension: Secondary | ICD-10-CM | POA: Diagnosis not present

## 2018-01-17 DIAGNOSIS — R05 Cough: Secondary | ICD-10-CM | POA: Diagnosis not present

## 2018-01-17 DIAGNOSIS — R739 Hyperglycemia, unspecified: Secondary | ICD-10-CM | POA: Diagnosis not present

## 2018-01-17 MED ORDER — ATORVASTATIN CALCIUM 40 MG PO TABS: 40.0000 mg | ORAL_TABLET | Freq: Every day | ORAL | 4 refills | Status: DC

## 2018-01-17 MED ORDER — AMLODIPINE BESYLATE 10 MG PO TABS: 10.0000 mg | ORAL_TABLET | Freq: Every day | ORAL | 4 refills | Status: DC

## 2018-01-17 MED ORDER — HYDROCHLOROTHIAZIDE 25 MG PO TABS: 25.0000 mg | ORAL_TABLET | Freq: Every day | ORAL | 4 refills | Status: DC

## 2018-01-17 MED ORDER — LOSARTAN POTASSIUM 50 MG PO TABS: 100.0000 mg | ORAL_TABLET | Freq: Every day | ORAL | 4 refills | Status: DC

## 2018-01-17 MED ORDER — ATORVASTATIN CALCIUM 40 MG PO TABS: 40.0000 mg | ORAL_TABLET | Freq: Every day | ORAL | 3 refills | Status: DC

## 2018-01-17 MED ORDER — AMLODIPINE BESYLATE 10 MG PO TABS: 10.0000 mg | ORAL_TABLET | Freq: Every day | ORAL | 3 refills | Status: DC

## 2018-01-17 MED ORDER — HYDROCHLOROTHIAZIDE 25 MG PO TABS: 25.0000 mg | ORAL_TABLET | Freq: Every day | ORAL | 3 refills | Status: DC

## 2018-01-17 NOTE — Assessment & Plan Note (Addendum)
Switching from lisinopril 40mg  qd to losartan 100mg  qd due to dry cough.   - f/u in two months - reassess pitting edema on f/u and prescribe compression stockings if necessary

## 2018-01-17 NOTE — Progress Notes (Signed)
CC: Cough  HPI:  Sherri Delgado is a 72 yo female with hx of HTN, HLD, cervical cancer s/p hysterectomy 1995, urge incontinence s/p hysterectomy, osteopenia here today for dry cough that has been going on for about one year. She denies SOB, recent weight loss, night sweats. She states she wakes up coughing sometimes at night. She does not smoke. She has been taking lisinopril for many years and recently read that it could cause cough. She has some minor swelling in her lower legs that she states improves when she props her legs up and gets worse when she's on her feet at work.  Patient also has not been taking lipitor, which may have been accidentally discontinued. She also states she has had problems with incontinence ever since her hysterectomy surgery that has gotten worse over time. She cannot leave her house without wearing a diaper and is requesting pads. When she feels like she has to go to the bathroom she often cannot hold it. She does not leak but occasionally pees a little when she coughs. She was previously told this was due to her hysterectomy and has similar issues with bowel movements since her surgery. We discussed possibly going to a gynecologist in the future to see if there are alternate options, but she would like to stick with pads for now.  Patient agrees to screening mammogram and DEXA scan as both are due and last dexascan showed lumbar osteopenia. She declined pneumonia vaccination   Past Medical History:  Diagnosis Date  . Arthritis   . Cancer ([place])    cervical in her 40's  . Depression    Hx of with divorce  . History of cervical cancer    s/p hysterectomyy. Most recent pap in 2009 normal  . Hyperlipidemia   . Hypertension   . Obesity   . Osteopenia    DEXA 08/2003, T score lumbar -1.9   Review of Systems:    Constitution: no recent weight loss  Cardio: no SOB, dry cough, nothing coming up  Review of Systems  Constitutional: Negative for chills, fever and  weight loss.  HENT: Negative for congestion, sinus pain and sore throat.   Respiratory: Positive for cough. Negative for hemoptysis, sputum production, shortness of breath and wheezing.   Cardiovascular: Positive for leg swelling. Negative for chest pain, palpitations and PND.  Gastrointestinal: Negative for abdominal pain, diarrhea, nausea and vomiting.  Genitourinary: Positive for urgency. Negative for dysuria, flank pain and frequency.  Musculoskeletal: Negative for falls and myalgias.  Neurological: Negative for dizziness, tremors, speech change, focal weakness, weakness and headaches.  Psychiatric/Behavioral: Negative for memory loss and substance abuse. The patient is not nervous/anxious.   All other systems reviewed and are negative.       Vitals:   01/17/18 1448  BP: 135/75  Pulse: 70  Temp: 98.6 F (37 C)  TempSrc: Oral  SpO2: 98%  Weight: 214 lb 0.3 oz (97.1 kg)  Height: 5\' 5"  (1.651 m)    Physical Exam  Constitutional: She is oriented to person, place, and time. Vital signs are normal. She appears well-developed and well-nourished. She is active and cooperative.  Eyes: Pupils are equal, round, and reactive to light. Conjunctivae and EOM are normal.  Cardiovascular: Normal rate and regular rhythm.  Respiratory: Effort normal and breath sounds normal. No tachypnea.  GI: Soft. Bowel sounds are normal. There is no tenderness.  Musculoskeletal: Normal range of motion.  +1 pitting edema LE b/l  Neurological: She is alert  and oriented to person, place, and time. No cranial nerve deficit.     Assessment & Plan:   See Encounters Tab for problem based charting.   #Dry cough - switch to losartan 100mg  qd from lisinopril 40mg  qd #Screening - mammogram and dexa scan #incontinence - DME pads ordered  #LE edema - f/u two months, compression stockings if necessary  Patient seen with Dr. Evette Doffing

## 2018-01-17 NOTE — Assessment & Plan Note (Signed)
Negative pap smear 2009, 2014, and 2015. Last gyn visit she was told no more follow-up was necessary

## 2018-01-17 NOTE — Assessment & Plan Note (Signed)
Patient's lipitor was discontinued in December, will restart Lipitor 40mg  qd

## 2018-01-17 NOTE — Assessment & Plan Note (Signed)
Screening DEXA scan scheduled at appointment 7/17

## 2018-01-18 LAB — HEMOGLOBIN A1C
Est. average glucose Bld gHb Est-mCnc: 117 mg/dL
Hgb A1c MFr Bld: 5.7 % — ABNORMAL HIGH (ref 4.8–5.6)

## 2018-01-18 NOTE — Progress Notes (Signed)
Internal Medicine Clinic Attending  I saw and evaluated the patient.  I personally confirmed the key portions of the history and exam documented by Dr. Sharon Seller and I reviewed pertinent patient test results.  The assessment, diagnosis, and plan were formulated together and I agree with the documentation in the resident's note.  Chronic non-productive cough could be due to lisinopril, I agree with switching to losartan.

## 2018-01-22 ENCOUNTER — Encounter: Payer: Self-pay | Admitting: *Deleted

## 2018-01-26 ENCOUNTER — Telehealth: Payer: Self-pay | Admitting: Internal Medicine

## 2018-02-15 ENCOUNTER — Ambulatory Visit (INDEPENDENT_AMBULATORY_CARE_PROVIDER_SITE_OTHER): Payer: Medicare HMO | Admitting: Orthopaedic Surgery

## 2018-03-22 IMAGING — DX DG KNEE 1-2V PORT*L*
2 series · 2 of 2 positions shown · non-contrast
Comparison: None.

CLINICAL DATA: Left total knee replacement.

EXAM:
PORTABLE LEFT KNEE - 1-2 VIEW

[knee ap]
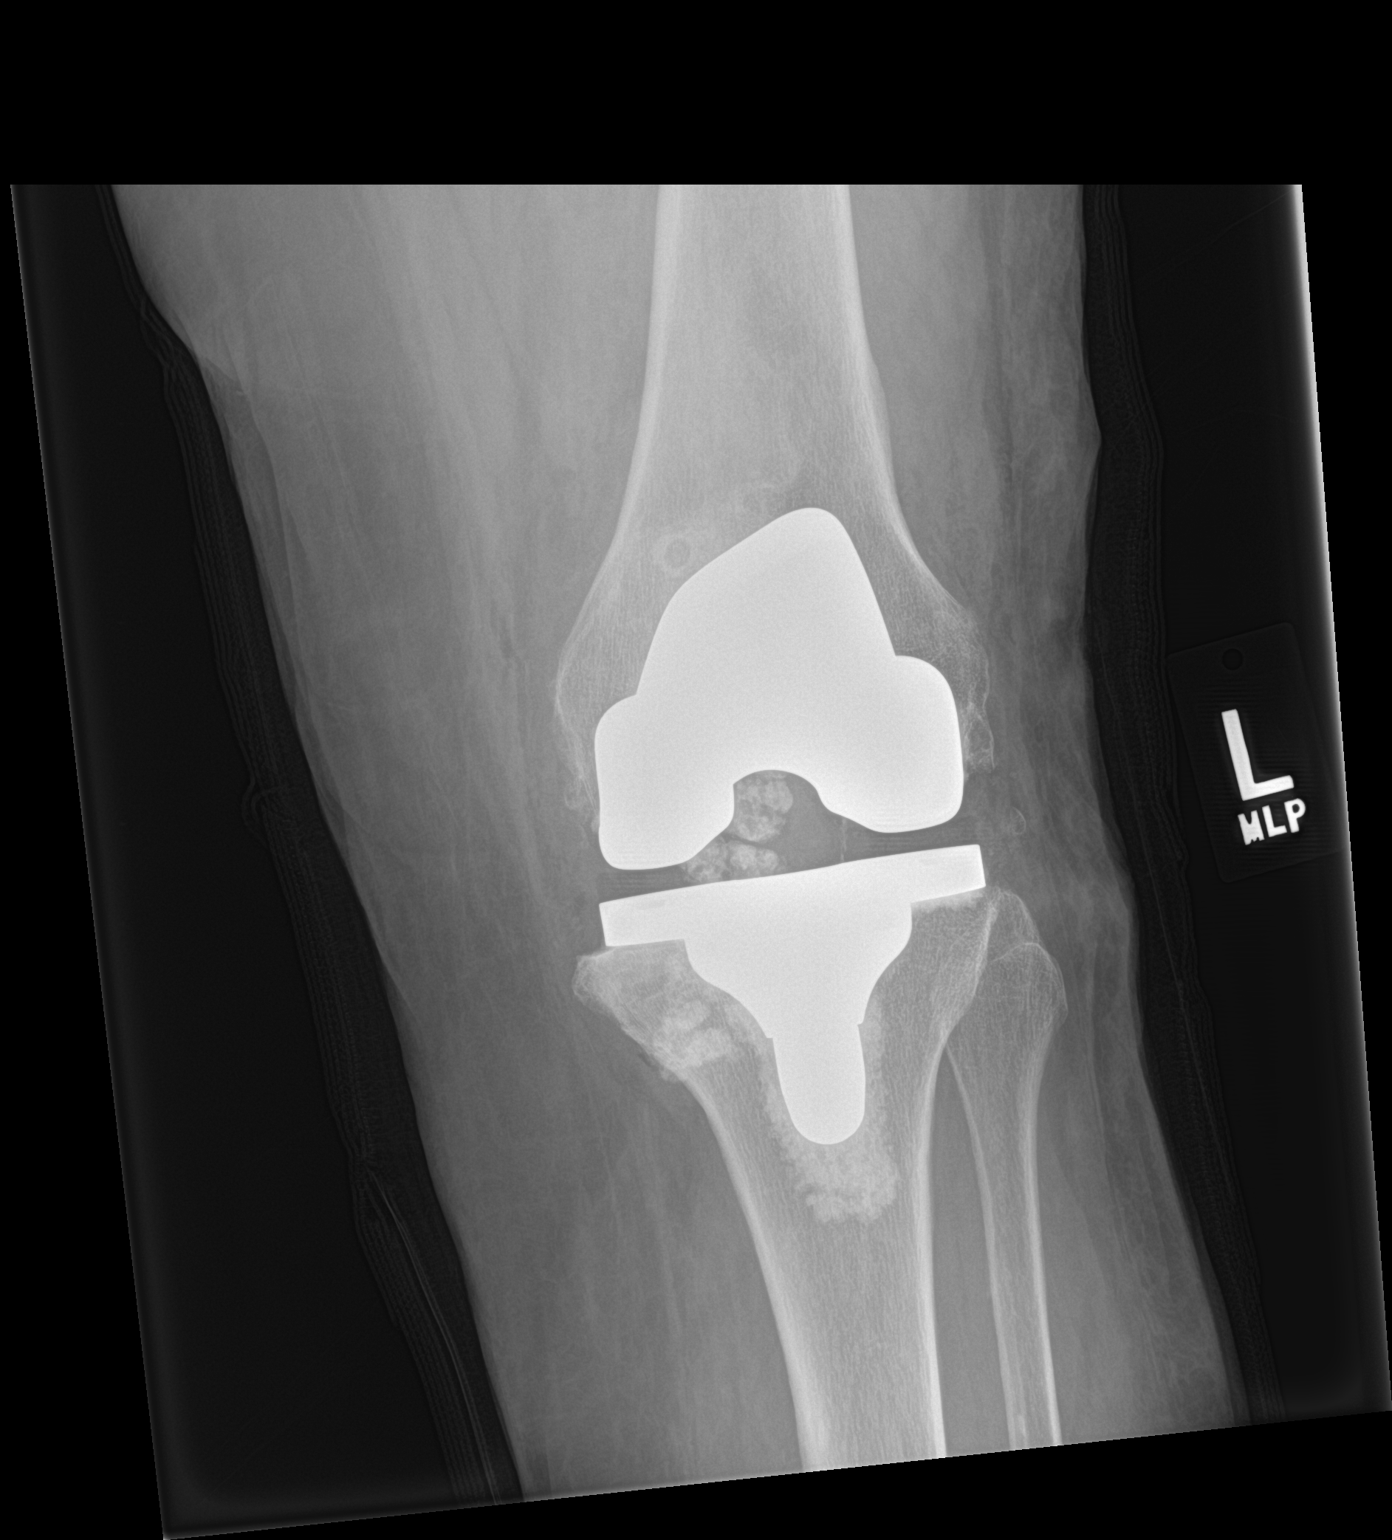

[knee lat]
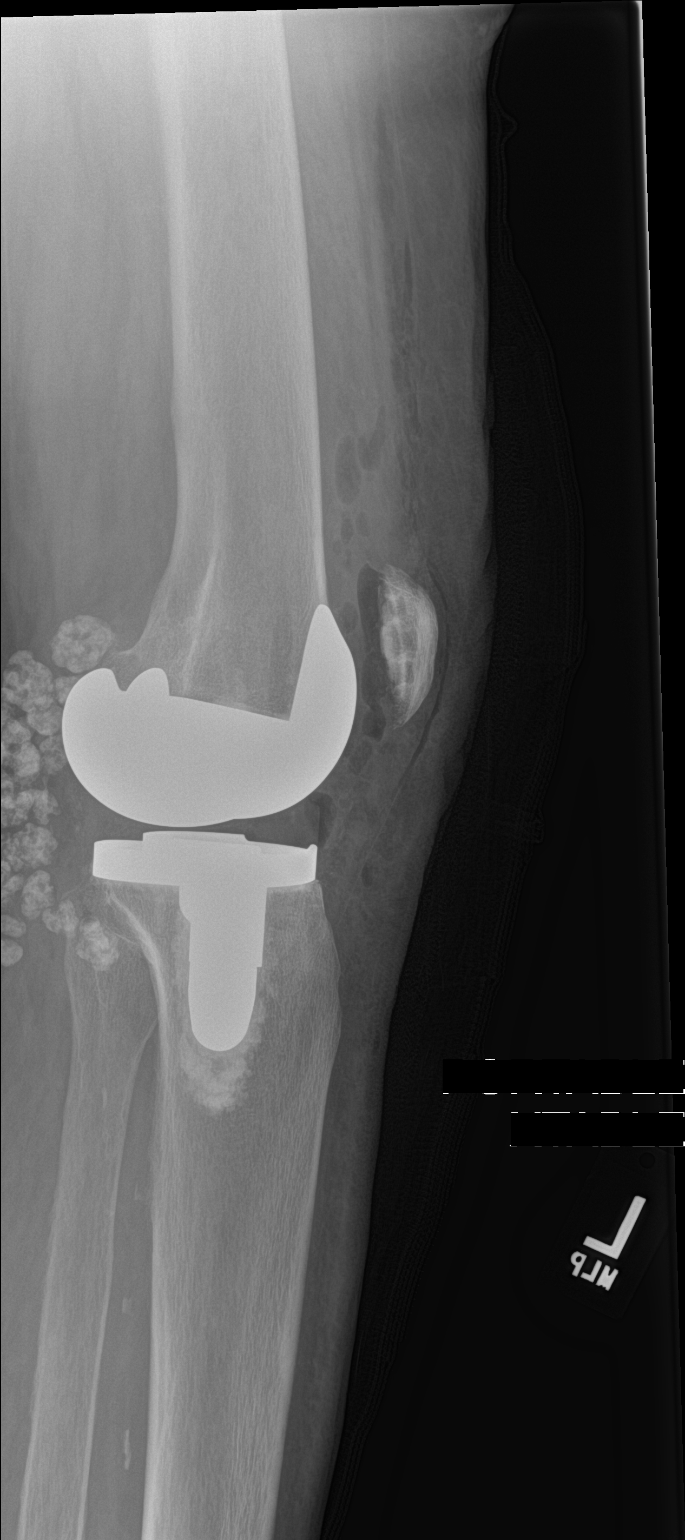

[2 of 2 positions shown; findings below may reference images not displayed]

FINDINGS: Left total knee arthroplasty has been performed. The knee is
located. Gas fluid are present in the joint as expected. Loose
bodies are again seen within the popliteal fossa, presumably within
a Baker cyst.
IMPRESSION: 1. Status post left total knee arthroplasty without radiographic
evidence for complication.
2. Stable appearance of loose bodies within the posterior joint,
presumably within a Baker's cyst.
These results will be called to the ordering clinician or
representative by the Radiologist Assistant, and communication
documented in the PACS or zVision Dashboard.

## 2018-04-09 ENCOUNTER — Other Ambulatory Visit: Payer: Self-pay | Admitting: Internal Medicine

## 2018-04-09 DIAGNOSIS — Z1231 Encounter for screening mammogram for malignant neoplasm of breast: Secondary | ICD-10-CM

## 2018-04-12 ENCOUNTER — Other Ambulatory Visit: Payer: Self-pay

## 2018-04-12 ENCOUNTER — Encounter: Payer: Self-pay | Admitting: Internal Medicine

## 2018-04-12 ENCOUNTER — Ambulatory Visit (INDEPENDENT_AMBULATORY_CARE_PROVIDER_SITE_OTHER): Payer: Medicare HMO | Admitting: Internal Medicine

## 2018-04-12 VITALS — BP 119/68 | HR 62 | Temp 98.9°F | Ht 65.0 in | Wt 212.7 lb

## 2018-04-12 DIAGNOSIS — I10 Essential (primary) hypertension: Secondary | ICD-10-CM | POA: Diagnosis not present

## 2018-04-12 MED ORDER — ATORVASTATIN CALCIUM 40 MG PO TABS: 40.0000 mg | ORAL_TABLET | Freq: Every day | ORAL | 3 refills | Status: DC

## 2018-04-12 MED ORDER — HYDROCHLOROTHIAZIDE 25 MG PO TABS: 25.0000 mg | ORAL_TABLET | Freq: Every day | ORAL | 3 refills | Status: DC

## 2018-04-12 MED ORDER — AMLODIPINE BESYLATE 10 MG PO TABS: 10.0000 mg | ORAL_TABLET | Freq: Every day | ORAL | 3 refills | Status: DC

## 2018-04-12 MED ORDER — LOSARTAN POTASSIUM 100 MG PO TABS: 100.0000 mg | ORAL_TABLET | Freq: Every day | ORAL | 3 refills | Status: DC

## 2018-04-12 NOTE — Assessment & Plan Note (Addendum)
Patient is here for blood pressure follow-up.  Her last visit, her lisinopril 40 mg was changed to losartan 100 mg daily due to side effect of cough.  She is also taking hydrochlorthiazide 25 mg daily and amlodipine 10 mg daily.  Today she reports resolution of her cough with medication change.  She is tolerating losartan well.  Blood pressure today is well controlled, 119/68.  Continue current regimen. --Amlodipine 10 mg daily -Losartan 100 mg daily -Hydrochlorthiazide 25 mg daily - Follow-up BMP  ADDENDUM: BMP within normal limits. Called patient with results.

## 2018-04-12 NOTE — Progress Notes (Signed)
   CC: HTN follow up  HPI:  Sherri Delgado is a 72 y.o. female with past medical history outlined below here for HTN follow up. For the details of today's visit, please refer to the assessment and plan.  Past Medical History:  Diagnosis Date  . Arthritis   . Cancer ([place])    cervical in her 40's  . Depression    Hx of with divorce  . History of cervical cancer    s/p hysterectomyy. Most recent pap in 2009 normal  . Hyperlipidemia   . Hypertension   . Obesity   . Osteopenia    DEXA 08/2003, T score lumbar -1.9    Review of Systems  Respiratory: Negative for shortness of breath.   Cardiovascular: Negative for chest pain.    Physical Exam:  Vitals:   04/12/18 1003  BP: 119/68  Pulse: 62  Temp: 98.9 F (37.2 C)  TempSrc: Oral  SpO2: 96%  Weight: 212 lb 11.2 oz (96.5 kg)  Height: 5\' 5"  (1.651 m)    Constitutional: NAD, appears comfortable Cardiovascular: RRR, no murmurs, rubs, or gallops.  Pulmonary/Chest: CTAB, no wheezes, rales, or rhonchi.  Extremities: Warm and well perfused.  No edema.  Psychiatric: Normal mood and affect  Assessment & Plan:   See Encounters Tab for problem based charting.  Patient discussed with Dr. Evette Doffing

## 2018-04-12 NOTE — Progress Notes (Signed)
Internal Medicine Clinic Attending  Case discussed with Dr. Guilloud at the time of the visit.  We reviewed the resident's history and exam and pertinent patient test results.  I agree with the assessment, diagnosis, and plan of care documented in the resident's note.  

## 2018-04-12 NOTE — Patient Instructions (Signed)
Sherri Delgado,  It was a pleasure to meet you. You are doing a great job with your blood pressure, keep up the good work. Continue to take all of your medicines as previously prescribed. I have sent refills to your pharmacy. I will call you with the results of your blood work today. Follow up with your primary care doctor in the next 6 months. If you have any questions or concerns, call our clinic at [phone] or after hours call [phone] and ask for the internal medicine resident on call. Thank you!   Dr. Philipp Ovens

## 2018-04-13 LAB — BMP8+ANION GAP
ANION GAP: 19 mmol/L — AB (ref 10.0–18.0)
BUN/Creatinine Ratio: 22 (ref 12–28)
BUN: 16 mg/dL (ref 8–27)
CALCIUM: 9.6 mg/dL (ref 8.7–10.3)
CO2: 26 mmol/L (ref 20–29)
Chloride: 98 mmol/L (ref 96–106)
Creatinine, Ser: 0.74 mg/dL (ref 0.57–1.00)
GFR, EST AFRICAN AMERICAN: 94 mL/min/{1.73_m2} (ref >59)
GFR, EST NON AFRICAN AMERICAN: 82 mL/min/{1.73_m2} (ref >59)
Glucose: 89 mg/dL (ref 65–99)
Potassium: 3.4 mmol/L — ABNORMAL LOW (ref 3.5–5.2)
Sodium: 143 mmol/L (ref 134–144)

## 2018-05-17 ENCOUNTER — Ambulatory Visit: Payer: Medicare HMO

## 2018-05-25 ENCOUNTER — Ambulatory Visit
Admission: RE | Admit: 2018-05-25 | Discharge: 2018-05-25 | Disposition: A | Discharge status: Home / Self Care | Payer: Medicare HMO | Source: Ambulatory Visit | Attending: Oncology | Admitting: Oncology

## 2018-05-25 DIAGNOSIS — Z1231 Encounter for screening mammogram for malignant neoplasm of breast: Secondary | ICD-10-CM

## 2018-11-23 DIAGNOSIS — H524 Presbyopia: Secondary | ICD-10-CM | POA: Diagnosis not present

## 2019-01-07 ENCOUNTER — Encounter: Payer: Self-pay | Admitting: Internal Medicine

## 2019-01-11 ENCOUNTER — Encounter: Payer: Self-pay | Admitting: Internal Medicine

## 2019-01-11 ENCOUNTER — Ambulatory Visit (INDEPENDENT_AMBULATORY_CARE_PROVIDER_SITE_OTHER): Payer: Medicare HMO | Admitting: Internal Medicine

## 2019-01-11 ENCOUNTER — Other Ambulatory Visit: Payer: Self-pay

## 2019-01-11 VITALS — Ht 64.0 in | Wt 208.0 lb

## 2019-01-11 DIAGNOSIS — Z0000 Encounter for general adult medical examination without abnormal findings: Secondary | ICD-10-CM

## 2019-01-11 NOTE — Progress Notes (Signed)
This AWV is being conducted by [facility] only. The patient was located at home and I was located in [facility]. The patient's identity was confirmed using their DOB and current address. The patient or his/her legal guardian has consented to being evaluated through a telephone encounter and understands the associated risks (an examination cannot be done and the patient may need to come in for an appointment) / benefits (allows the patient to remain at home, decreasing exposure to coronavirus). I personally spent 40 minutes conducting the AWV.  Subjective:   Sherri Delgado is a 73 y.o. female who presents for a Medicare Annual Wellness Visit.  The following items have been reviewed and updated today in the appropriate area in the EMR.   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 There are not steps up to the entrance the patient uses. Her niece lives upstairs.       Patient's method of transportation is personal SUV 4x4.      The highest level of education was high school diploma.      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      Identified important Relationships are "My niece and my family."       Pets : None       Interests / Fun: "I love technology, computers, phones, anything that's up-to-date. I play word games on my tablet."       Current Stressors: I don't have any right now. I'm too blessed to be stressed."       Religious / Personal Beliefs: "I believe in God, go on Zoom every Sunday to hear my preacher, and I pay my tithe."       Other: "I surround myself with positive people."       L. Gibran Veselka, RN, BSN          Objective:    Vitals: Ht 5\' 4"  (1.626 m)   Wt 208 lb (94.3 kg)   LMP 02/23/1997   BMI 35.70 kg/m  Vitals are unable to obtained due to [id number] public health emergency  Activities of Daily Living In your present state of health, do you have any difficulty performing the following activities: 01/11/2019 04/12/2018  Hearing? N N  Vision? N N  Difficulty concentrating or making decisions? N N  Walking or climbing stairs? N N  Dressing or bathing? N N  Doing errands, shopping? N N  Some recent data might be hidden    Goals Goals    . Blood Pressure < 140/90     Begin chair/standing exercises with red exercise band 20 min per day at least 3 days per week. LDL < 100 (patient stated)  Fall Risk Fall Risk  01/11/2019 04/12/2018 01/17/2018 11/02/2016 03/31/2016  Falls in the past year? 0 No No No No  Comment - - - - -  Number falls in past yr: - - - - -  Comment - - - - -  Injury with Fall? - - - - -  Risk for fall due to : - - - Medication side effect -  Follow up Education provided;Falls prevention discussed - - - -    CDC Handout on Fall Prevention and Handout on Home Exercise Program, Access codes [email or web address] and [email or web address] given/mailed to patient with exercise band.   Depression Screen PHQ 2/9 Scores 01/11/2019 04/12/2018 01/17/2018 11/02/2016  PHQ - 2 Score 0 0 0 0  PHQ- 9 Score 5 2 - -     Cognitive Testing Six-Item Cognitive Screener   "I would like to ask you some questions that ask you to use your memory. I am going to name three objects. Please wait until I say all three words, then repeat them. Remember what they are  because I am going to ask you to name them again in a few minutes.  Please repeat these words for me: APPLE-TABLE-PENNY." (Interviewer may repeat names 3 times if necessary but repetition not scored.)  Did patient correctly repeat all three words? Yes - may proceed with screen  What year is this? Correct What month is this? Correct What day of the week is this? Correct  What were the three objects I asked you to remember? . Apple Correct . Table Correct . Penny Correct  Score one point for each incorrect answer.  A score of 2 or more points warrants additional investigation.  Patient's score 0   Assessment and Plan:   Patient had eye exam at [facility] on [facility] 10/2018 and received new eyelgasses Encouraged patient to have Dexa scan but she is not interested at this time. States, "If it ain't broke, don't fix it."  Schedulled f/u with PCP for 01/16/2019 at [place]. Will need lipid panel at that time.  During the course of the visit the patient was educated and counseled about appropriate screening and preventive services as documented in the assessment and plan.  The printed AVS was given to the patient and included an updated screening schedule, a list of risk factors, and personalized health advice.        Velora Heckler, RN  01/11/2019

## 2019-01-11 NOTE — Patient Instructions (Addendum)
Annual Wellness Visit   Medicare Covered Preventative Screenings and Services  Services & Screenings Men and Women Who How Often Need? Date of Last Service Action  Abdominal Aortic Aneurysm Adults with AAA risk factors Once     Alcohol Misuse and Counseling All Adults Screening once a year if no alcohol misuse. Counseling up to 4 face to face sessions.     Bone Density Measurement  Adults at risk for osteoporosis Once every 2 yrs Yes    Lipid Panel Z13.6 All adults without CV disease Once every 5 yrs Yes    Colorectal Cancer   Stool sample or  Colonoscopy All adults 36 and older   Once every year  Every 10 years     Depression All Adults Once a year  Today                     PHQ-9 = 5  Diabetes Screening Blood glucose, post glucose load, or GTT Z13.1  All adults at risk  Pre-diabetics  Once per year  Twice per year     Diabetes  Self-Management Training All adults Diabetics 10 hrs first year; 2 hours subsequent years. Requires Copay     Glaucoma  Diabetics  Family history of glaucoma  African Americans 93 yrs +  Hispanic Americans 17 yrs + Annually - requires coppay     Hepatitis C Z72.89 or F19.20  High Risk for HCV  Born between 1945 and 1965  Annually  Once     HIV Z11.4 All adults based on risk  Annually btw ages 65 & 21 regardless of risk  Annually > 65 yrs if at increased risk     Lung Cancer Screening Asymptomatic adults aged 61-77 with 30 pack yr history and current smoker OR quit within the last 15 yrs Annually Must have counseling and shared decision making documentation before first screen     Medical Nutrition Therapy Adults with   Diabetes  Renal disease  Kidney transplant within past 3 yrs 3 hours first year; 2 hours subsequent years     Obesity and Counseling All adults Screening once a year Counseling if BMI 30 or higher  Today   Tobacco Use Counseling Adults who use tobacco  Up to 8 visits in one year     Vaccines Z23  Hepatitis B   Influenza   Pneumonia  Adults   Once  Once every flu season  Two different vaccines separated by one year         Flu vaccine beginning Sept 1  Next Annual Wellness Visit People with Medicare Every year  Today     Services & Screenings Women Who How Often Need  Date of Last Service Action  Mammogram  Z12.31 Women over 35 One baseline ages 59-39. Annually ager 40 yrs+     Pap tests All women Annually if high risk. Every 2 yrs for normal risk women     Screening for cervical cancer with   Pap (Z01.419 nl or Z01.411abnl) &  HPV Z11.51 Women aged 8 to 33 Once every 5 yrs     Screening pelvic and breast exams All women Annually if high risk. Every 2 yrs for normal risk women     Sexually Transmitted Diseases  Chlamydia  Gonorrhea  Syphilis All at risk adults Annually for non pregnant females at increased risk         [place] Men Who How Ofter Need  Date of Last Service  Action  Prostate Cancer - DRE & PSA Men over 50 Annually.  DRE might require a copay.     Sexually Transmitted Diseases  Syphilis All at risk adults Annually for men at increased risk         Things That May Be Affecting Your Health:  Alcohol  Hearing loss  Pain    Depression  Home Safety  Sexual Health   Diabetes  Lack of physical activity  Stress   Difficulty with daily activities  Loneliness  Tiredness   Drug use  Medicines  Tobacco use   Falls  Motor Vehicle Safety X Weight   Food choices  Oral Health X Other (Cough, Urinary incontinence)    YOUR PERSONALIZED HEALTH PLAN : 1. Schedule your next subsequent Medicare Wellness visit in one year 2. Attend all of your regular appointments to address your medical issues 3. Complete the preventative screenings and services 4. Come to appt with Dr. Sharon Seller on 01/16/2019 at 8:45 AM 5. Consider having bone density test 6. Begin standing/chair exercises with red exercise band  Bone Density Test The bone density test uses a special  type of X-ray to measure the amount of calcium and other minerals in your bones. It can measure bone density in the hip and the spine. The test procedure is similar to having a regular X-ray. This test may also be called:  Bone densitometry.  Bone mineral density test.  Dual-energy X-ray absorptiometry (DEXA). You may have this test to:  Diagnose a condition that causes weak or thin bones (osteoporosis).  Screen you for osteoporosis.  Predict your risk for a broken bone (fracture).  Determine how well your osteoporosis treatment is working. Tell a health care provider about:  Any allergies you have.  All medicines you are taking, including vitamins, herbs, eye drops, creams, and over-the-counter medicines.  Any problems you or family members have had with anesthetic medicines.  Any blood disorders you have.  Any surgeries you have had.  Any medical conditions you have.  Whether you are pregnant or may be pregnant.  Any medical tests you have had within the past 14 days that used contrast material. What are the risks? Generally, this is a safe procedure. However, it does expose you to a small amount of radiation, which can slightly increase your cancer risk. What happens before the procedure?  Do not take any calcium supplements starting 24 hours before your test.  Remove all metal jewelry, eyeglasses, dental appliances, and any other metal objects. What happens during the procedure?   You will lie down on an exam table. There will be an X-ray generator below you and an imaging device above you.  Other devices, such as boxes or braces, may be used to position your body properly for the scan.  The machine will slowly scan your body. You will need to keep still.  The images will show up on a screen in the room. Images will be examined by a specialist after your test is done. The procedure may vary among health care providers and hospitals. What happens after the  procedure?  It is up to you to get your test results. Ask your health care provider, or the department that is doing the test, when your results will be ready. Summary  A bone density test is an imaging test that uses a type of X-ray to measure the amount of calcium and other minerals in your bones.  The test may be used to diagnose or screen  you for a condition that causes weak or thin bones (osteoporosis), predict your risk for a broken bone (fracture), or determine how well your osteoporosis treatment is working.  Do not take any calcium supplements starting 24 hours before your test.  Ask your health care provider, or the department that is doing the test, when your results will be ready. This information is not intended to replace advice given to you by your health care provider. Make sure you discuss any questions you have with your health care provider. Document Released: 07/12/2004 Document Revised: 07/06/2017 Document Reviewed: 04/24/2017 Elsevier Patient Education  2020 [place] Prevention in the Home, Adult Falls can cause injuries. They can happen to people of all ages. There are many things you can do to make your home safe and to help prevent falls. Ask for help when making these changes, if needed. What actions can I take to prevent falls? General Instructions  Use good lighting in all rooms. Replace any light bulbs that burn out.  Turn on the lights when you go into a dark area. Use night-lights.  Keep items that you use often in easy-to-reach places. Lower the shelves around your home if necessary.  Set up your furniture so you have a clear path. Avoid moving your furniture around.  Do not have throw rugs and other things on the floor that can make you trip.  Avoid walking on wet floors.  If any of your floors are uneven, fix them.  Add color or contrast paint or tape to clearly mark and help you see: ? Any grab bars or handrails. ? First and last  steps of stairways. ? Where the edge of each step is.  If you use a stepladder: ? Make sure that it is fully opened. Do not climb a closed stepladder. ? Make sure that both sides of the stepladder are locked into place. ? Ask someone to hold the stepladder for you while you use it.  If there are any pets around you, be aware of where they are. What can I do in the bathroom?      Keep the floor dry. Clean up any water that spills onto the floor as soon as it happens.  Remove soap buildup in the tub or shower regularly.  Use non-skid mats or decals on the floor of the tub or shower.  Attach bath mats securely with double-sided, non-slip rug tape.  If you need to sit down in the shower, use a plastic, non-slip stool.  Install grab bars by the toilet and in the tub and shower. Do not use towel bars as grab bars. What can I do in the bedroom?  Make sure that you have a light by your bed that is easy to reach.  Do not use any sheets or blankets that are too big for your bed. They should not hang down onto the floor.  Have a firm chair that has side arms. You can use this for support while you get dressed. What can I do in the kitchen?  Clean up any spills right away.  If you need to reach something above you, use a strong step stool that has a grab bar.  Keep electrical cords out of the way.  Do not use floor polish or wax that makes floors slippery. If you must use wax, use non-skid floor wax. What can I do with my stairs?  Do not leave any items on the stairs.  Make sure  that you have a light switch at the top of the stairs and the bottom of the stairs. If you do not have them, ask someone to add them for you.  Make sure that there are handrails on both sides of the stairs, and use them. Fix handrails that are broken or loose. Make sure that handrails are as long as the stairways.  Install non-slip stair treads on all stairs in your home.  Avoid having throw rugs at the  top or bottom of the stairs. If you do have throw rugs, attach them to the floor with carpet tape.  Choose a carpet that does not hide the edge of the steps on the stairway.  Check any carpeting to make sure that it is firmly attached to the stairs. Fix any carpet that is loose or worn. What can I do on the outside of my home?  Use bright outdoor lighting.  Regularly fix the edges of walkways and driveways and fix any cracks.  Remove anything that might make you trip as you walk through a door, such as a raised step or threshold.  Trim any bushes or trees on the path to your home.  Regularly check to see if handrails are loose or broken. Make sure that both sides of any steps have handrails.  Install guardrails along the edges of any raised decks and porches.  Clear walking paths of anything that might make someone trip, such as tools or rocks.  Have any leaves, snow, or ice cleared regularly.  Use sand or salt on walking paths during winter.  Clean up any spills in your garage right away. This includes grease or oil spills. What other actions can I take?  Wear shoes that: ? Have a low heel. Do not wear high heels. ? Have rubber bottoms. ? Are comfortable and fit you well. ? Are closed at the toe. Do not wear open-toe sandals.  Use tools that help you move around (mobility aids) if they are needed. These include: ? Canes. ? Walkers. ? Scooters. ? Crutches.  Review your medicines with your doctor. Some medicines can make you feel dizzy. This can increase your chance of falling. Ask your doctor what other things you can do to help prevent falls. Where to find more information  Centers for Disease Control and Prevention, STEADI: https://garcia.biz/  [employer] on Aging: BrainJudge.co.uk Contact a doctor if:  You are afraid of falling at home.  You feel weak, drowsy, or dizzy at home.  You fall at home. Summary  There are many simple things that you  can do to make your home safe and to help prevent falls.  Ways to make your home safe include removing tripping hazards and installing grab bars in the bathroom.  Ask for help when making these changes in your home. This information is not intended to replace advice given to you by your health care provider. Make sure you discuss any questions you have with your health care provider. Document Released: 04/16/2009 Document Revised: 10/11/2018 Document Reviewed: 02/02/2017 Elsevier Patient Education  2020 [place] Maintenance, Female Adopting a healthy lifestyle and getting preventive care are important in promoting health and wellness. Ask your health care provider about:  The right schedule for you to have regular tests and exams.  Things you can do on your own to prevent diseases and keep yourself healthy. What should I know about diet, weight, and exercise? Eat a healthy diet   Eat a diet  that includes plenty of vegetables, fruits, low-fat dairy products, and lean protein.  Do not eat a lot of foods that are high in solid fats, added sugars, or sodium. Maintain a healthy weight Body mass index (BMI) is used to identify weight problems. It estimates body fat based on height and weight. Your health care provider can help determine your BMI and help you achieve or maintain a healthy weight. Get regular exercise Get regular exercise. This is one of the most important things you can do for your health. Most adults should:  Exercise for at least 150 minutes each week. The exercise should increase your heart rate and make you sweat (moderate-intensity exercise).  Do strengthening exercises at least twice a week. This is in addition to the moderate-intensity exercise.  Spend less time sitting. Even light physical activity can be beneficial. Watch cholesterol and blood lipids Have your blood tested for lipids and cholesterol at 73 years of age, then have this test every 5  years. Have your cholesterol levels checked more often if:  Your lipid or cholesterol levels are high.  You are older than 72 years of age.  You are at high risk for heart disease. What should I know about cancer screening? Depending on your health history and family history, you may need to have cancer screening at various ages. This may include screening for:  Breast cancer.  Cervical cancer.  Colorectal cancer.  Skin cancer.  Lung cancer. What should I know about heart disease, diabetes, and high blood pressure? Blood pressure and heart disease  High blood pressure causes heart disease and increases the risk of stroke. This is more likely to develop in people who have high blood pressure readings, are of African descent, or are overweight.  Have your blood pressure checked: ? Every 3-5 years if you are 70-3 years of age. ? Every year if you are 38 years old or older. Diabetes Have regular diabetes screenings. This checks your fasting blood sugar level. Have the screening done:  Once every three years after age 65 if you are at a normal weight and have a low risk for diabetes.  More often and at a younger age if you are overweight or have a high risk for diabetes. What should I know about preventing infection? Hepatitis B If you have a higher risk for hepatitis B, you should be screened for this virus. Talk with your health care provider to find out if you are at risk for hepatitis B infection. Hepatitis C Testing is recommended for:  Everyone born from [birth year] through 1964.  Anyone with known risk factors for hepatitis C. Sexually transmitted infections (STIs)  Get screened for STIs, including gonorrhea and chlamydia, if: ? You are sexually active and are younger than 72 years of age. ? You are older than 73 years of age and your health care provider tells you that you are at risk for this type of infection. ? Your sexual activity has changed since you were last  screened, and you are at increased risk for chlamydia or gonorrhea. Ask your health care provider if you are at risk.  Ask your health care provider about whether you are at high risk for HIV. Your health care provider may recommend a prescription medicine to help prevent HIV infection. If you choose to take medicine to prevent HIV, you should first get tested for HIV. You should then be tested every 3 months for as long as you are taking the medicine. Pregnancy  If you are about to stop having your period (premenopausal) and you may become pregnant, seek counseling before you get pregnant.  Take 400 to 800 micrograms (mcg) of folic acid every day if you become pregnant.  Ask for birth control (contraception) if you want to prevent pregnancy. Osteoporosis and menopause Osteoporosis is a disease in which the bones lose minerals and strength with aging. This can result in bone fractures. If you are 103 years old or older, or if you are at risk for osteoporosis and fractures, ask your health care provider if you should:  Be screened for bone loss.  Take a calcium or vitamin D supplement to lower your risk of fractures.  Be given hormone replacement therapy (HRT) to treat symptoms of menopause. Follow these instructions at home: Lifestyle  Do not use any products that contain nicotine or tobacco, such as cigarettes, e-cigarettes, and chewing tobacco. If you need help quitting, ask your health care provider.  Do not use street drugs.  Do not share needles.  Ask your health care provider for help if you need support or information about quitting drugs. Alcohol use  Do not drink alcohol if: ? Your health care provider tells you not to drink. ? You are pregnant, may be pregnant, or are planning to become pregnant.  If you drink alcohol: ? Limit how much you use to 0-1 drink a day. ? Limit intake if you are breastfeeding.  Be aware of how much alcohol is in your drink. In the [place] one  drink equals one 12 oz bottle of beer (355 mL), one 5 oz glass of wine (148 mL), or one 1 oz glass of hard liquor (44 mL). General instructions  Schedule regular health, dental, and eye exams.  Stay current with your vaccines.  Tell your health care provider if: ? You often feel depressed. ? You have ever been abused or do not feel safe at home. Summary  Adopting a healthy lifestyle and getting preventive care are important in promoting health and wellness.  Follow your health care provider's instructions about healthy diet, exercising, and getting tested or screened for diseases.  Follow your health care provider's instructions on monitoring your cholesterol and blood pressure. This information is not intended to replace advice given to you by your health care provider. Make sure you discuss any questions you have with your health care provider. Document Released: 01/03/2011 Document Revised: 06/13/2018 Document Reviewed: 06/13/2018 Elsevier Patient Education  2020 [employer].

## 2019-01-14 NOTE — Progress Notes (Signed)
Internal Medicine Clinic Attending  Case discussed with Dr. Seawell soon after the resident saw the patient.  We reviewed the AWV findings.  I agree with the assessment, diagnosis, and plan of care documented in the AWV note.     

## 2019-01-14 NOTE — Progress Notes (Signed)
I discussed the AWV findings with the RN who conducted the visit. I was present in the office suite and immediately available to provide assistance and direction throughout the time the service was provided.  Molli Hazard A, DO 01/14/2019, 8:56 AM Pager: [phone]

## 2019-01-16 ENCOUNTER — Encounter: Payer: Self-pay | Admitting: Internal Medicine

## 2019-01-16 ENCOUNTER — Other Ambulatory Visit: Payer: Self-pay

## 2019-01-16 ENCOUNTER — Ambulatory Visit (INDEPENDENT_AMBULATORY_CARE_PROVIDER_SITE_OTHER): Payer: Medicare HMO | Admitting: Internal Medicine

## 2019-01-16 ENCOUNTER — Other Ambulatory Visit: Payer: Self-pay | Admitting: Internal Medicine

## 2019-01-16 VITALS — BP 130/71 | HR 58 | Temp 98.9°F | Wt 219.3 lb

## 2019-01-16 DIAGNOSIS — E785 Hyperlipidemia, unspecified: Secondary | ICD-10-CM

## 2019-01-16 DIAGNOSIS — I10 Essential (primary) hypertension: Secondary | ICD-10-CM

## 2019-01-16 DIAGNOSIS — H538 Other visual disturbances: Secondary | ICD-10-CM | POA: Diagnosis not present

## 2019-01-16 DIAGNOSIS — Z79899 Other long term (current) drug therapy: Secondary | ICD-10-CM | POA: Diagnosis not present

## 2019-01-16 DIAGNOSIS — Z862 Personal history of diseases of the blood and blood-forming organs and certain disorders involving the immune mechanism: Secondary | ICD-10-CM | POA: Diagnosis not present

## 2019-01-16 DIAGNOSIS — D649 Anemia, unspecified: Secondary | ICD-10-CM | POA: Diagnosis not present

## 2019-01-16 DIAGNOSIS — Z0000 Encounter for general adult medical examination without abnormal findings: Secondary | ICD-10-CM

## 2019-01-16 DIAGNOSIS — M8580 Other specified disorders of bone density and structure, unspecified site: Secondary | ICD-10-CM

## 2019-01-16 MED ORDER — HYDROCHLOROTHIAZIDE 25 MG PO TABS: 25.0000 mg | ORAL_TABLET | Freq: Every day | ORAL | 3 refills | Status: AC

## 2019-01-16 MED ORDER — AMLODIPINE BESYLATE 10 MG PO TABS: 10.0000 mg | ORAL_TABLET | Freq: Every day | ORAL | 3 refills | Status: AC

## 2019-01-16 MED ORDER — LOSARTAN POTASSIUM 100 MG PO TABS: 100.0000 mg | ORAL_TABLET | Freq: Every day | ORAL | 3 refills | Status: DC

## 2019-01-16 MED ORDER — ATORVASTATIN CALCIUM 40 MG PO TABS: 40.0000 mg | ORAL_TABLET | Freq: Every day | ORAL | 3 refills | Status: AC

## 2019-01-16 NOTE — Progress Notes (Signed)
   CC: hypertension, preventative healthcare   HPI:  [name] is a 73 y.o. with PMH as below.   Please see A&P for assessment of the patient's acute and chronic medical conditions.   Past Medical History:  Diagnosis Date  . Arthritis   . Cancer ([place])    cervical in her 40's  . CERVICAL CANCER 05/10/2006   Annotation: S/p Hysterectomy '95 Qualifier: History of  By: Stefanie Libel MD, Dewitt Rota    . Depression    Hx of with divorce  . History of cervical cancer    s/p hysterectomyy. Most recent pap in 2009 normal  . Hyperlipidemia   . Hypertension   . Obesity   . Osteopenia    DEXA 08/2003, T score lumbar -1.9   Review of Systems:   Review of Systems  Constitutional: Negative for malaise/fatigue and weight loss (weight gain+).  HENT: Negative for congestion, hearing loss and sore throat.   Eyes: Positive for blurred vision (got new glasses that helped). Negative for double vision.  Respiratory: Negative for cough, shortness of breath and wheezing.   Cardiovascular: Positive for leg swelling (with soda). Negative for chest pain, palpitations and orthopnea.  Gastrointestinal: Negative for abdominal pain, constipation, diarrhea, nausea and vomiting.  Musculoskeletal: Negative for falls and myalgias.  Neurological: Negative for dizziness and headaches.    Physical Exam:  Constitution: NAD, obese, appears stated age HENT: [place]/AT Eyes: no icterus or injection  Cardio: RRR, no m/r/g, trace edema Respiratory: CTA, no wheezing rales or rhonchi  Abdominal: +BS, NTTP, soft, non-distended Neuro: alert & oriented, normal affect  Skin: c/d/i    Vitals:   01/16/19 0834 01/16/19 0910  BP: 139/82 130/71  Pulse: 71 (!) 58  Temp: 98.9 F (37.2 C)   TempSrc: Oral   SpO2: 99%   Weight: 219 lb 4.8 oz (99.5 kg)      Assessment & Plan:   See Encounters Tab for problem based charting.  Patient discussed with Dr. Angelia Mould

## 2019-01-16 NOTE — Assessment & Plan Note (Addendum)
She is on atorvastatin 40 mg qd. Last lipid panel 2016.   - lipid panel  - refill atorvastatin

## 2019-01-16 NOTE — Assessment & Plan Note (Signed)
History of normocytic anemia from 2018. Asymptomatic.   - repeat CBC

## 2019-01-16 NOTE — Assessment & Plan Note (Addendum)
BP Readings from Last 3 Encounters:  01/16/19 130/71  04/12/18 119/68  01/17/18 135/75   Blood pressure slightly elevated, but she states she was rushing in. Rechecked and was 130/72. She has been taking her medications without issue.   - refilled norvasc 10 mg qd, hctz 25 mg qd, and losartan 100 mg qd - BMP

## 2019-01-16 NOTE — Patient Instructions (Signed)
Thank you for allowing [place] to provide your care today. Today we discussed your hypertension.    I have ordered the following labs for you:  CBC, BMP, lipid panel    I will call if any are abnormal.    Please follow-up in one year.    Should you have any questions or concerns please call the internal medicine clinic at [phone].

## 2019-01-16 NOTE — Assessment & Plan Note (Signed)
Declined DEXA scan at recent healthcare wellness visit. Discussed today and she is open to having this done but would like to wait until there are less cases of covid.   - discuss DEXA scan once covid cases decrease.

## 2019-01-16 NOTE — Assessment & Plan Note (Signed)
Declined DEXA scan at recent healthcare wellness visit. Discussed today and she is open to having this done but would like to wait until there are less cases of covid.   - discuss DEXA scan once covid cases decrease.  - filled out form for work

## 2019-01-17 LAB — CBC
Hematocrit: 37.9 % (ref 34.0–46.6)
Hemoglobin: 13 g/dL (ref 11.1–15.9)
MCH: 29 pg (ref 26.6–33.0)
MCHC: 34.3 g/dL (ref 31.5–35.7)
MCV: 84 fL (ref 79–97)
Platelets: 226 10*3/uL (ref 150–450)
RBC: 4.49 x10E6/uL (ref 3.77–5.28)
RDW: 13.4 % (ref 11.7–15.4)
WBC: 5.7 10*3/uL (ref 3.4–10.8)

## 2019-01-17 LAB — BMP8+ANION GAP
Anion Gap: 18 mmol/L (ref 10.0–18.0)
BUN/Creatinine Ratio: 27 (ref 12–28)
BUN: 25 mg/dL (ref 8–27)
CO2: 23 mmol/L (ref 20–29)
Calcium: 9.7 mg/dL (ref 8.7–10.3)
Chloride: 99 mmol/L (ref 96–106)
Creatinine, Ser: 0.94 mg/dL (ref 0.57–1.00)
GFR calc Af Amer: 70 mL/min/{1.73_m2} (ref >59)
GFR calc non Af Amer: 61 mL/min/{1.73_m2} (ref >59)
Glucose: 99 mg/dL (ref 65–99)
Potassium: 3.8 mmol/L (ref 3.5–5.2)
Sodium: 140 mmol/L (ref 134–144)

## 2019-01-17 NOTE — Progress Notes (Signed)
Internal Medicine Clinic Attending  Case discussed with Dr. Seawell at the time of the visit.  We reviewed the resident's history and exam and pertinent patient test results.  I agree with the assessment, diagnosis, and plan of care documented in the resident's note.    

## 2019-01-24 IMAGING — DX DG KNEE 1-2V PORT*R*
2 series · 2 of 2 positions shown · non-contrast
Comparison: 02/11/2016

CLINICAL DATA: Post op knee replacement

EXAM:
PORTABLE RIGHT KNEE - 1-2 VIEW

[knee ap]
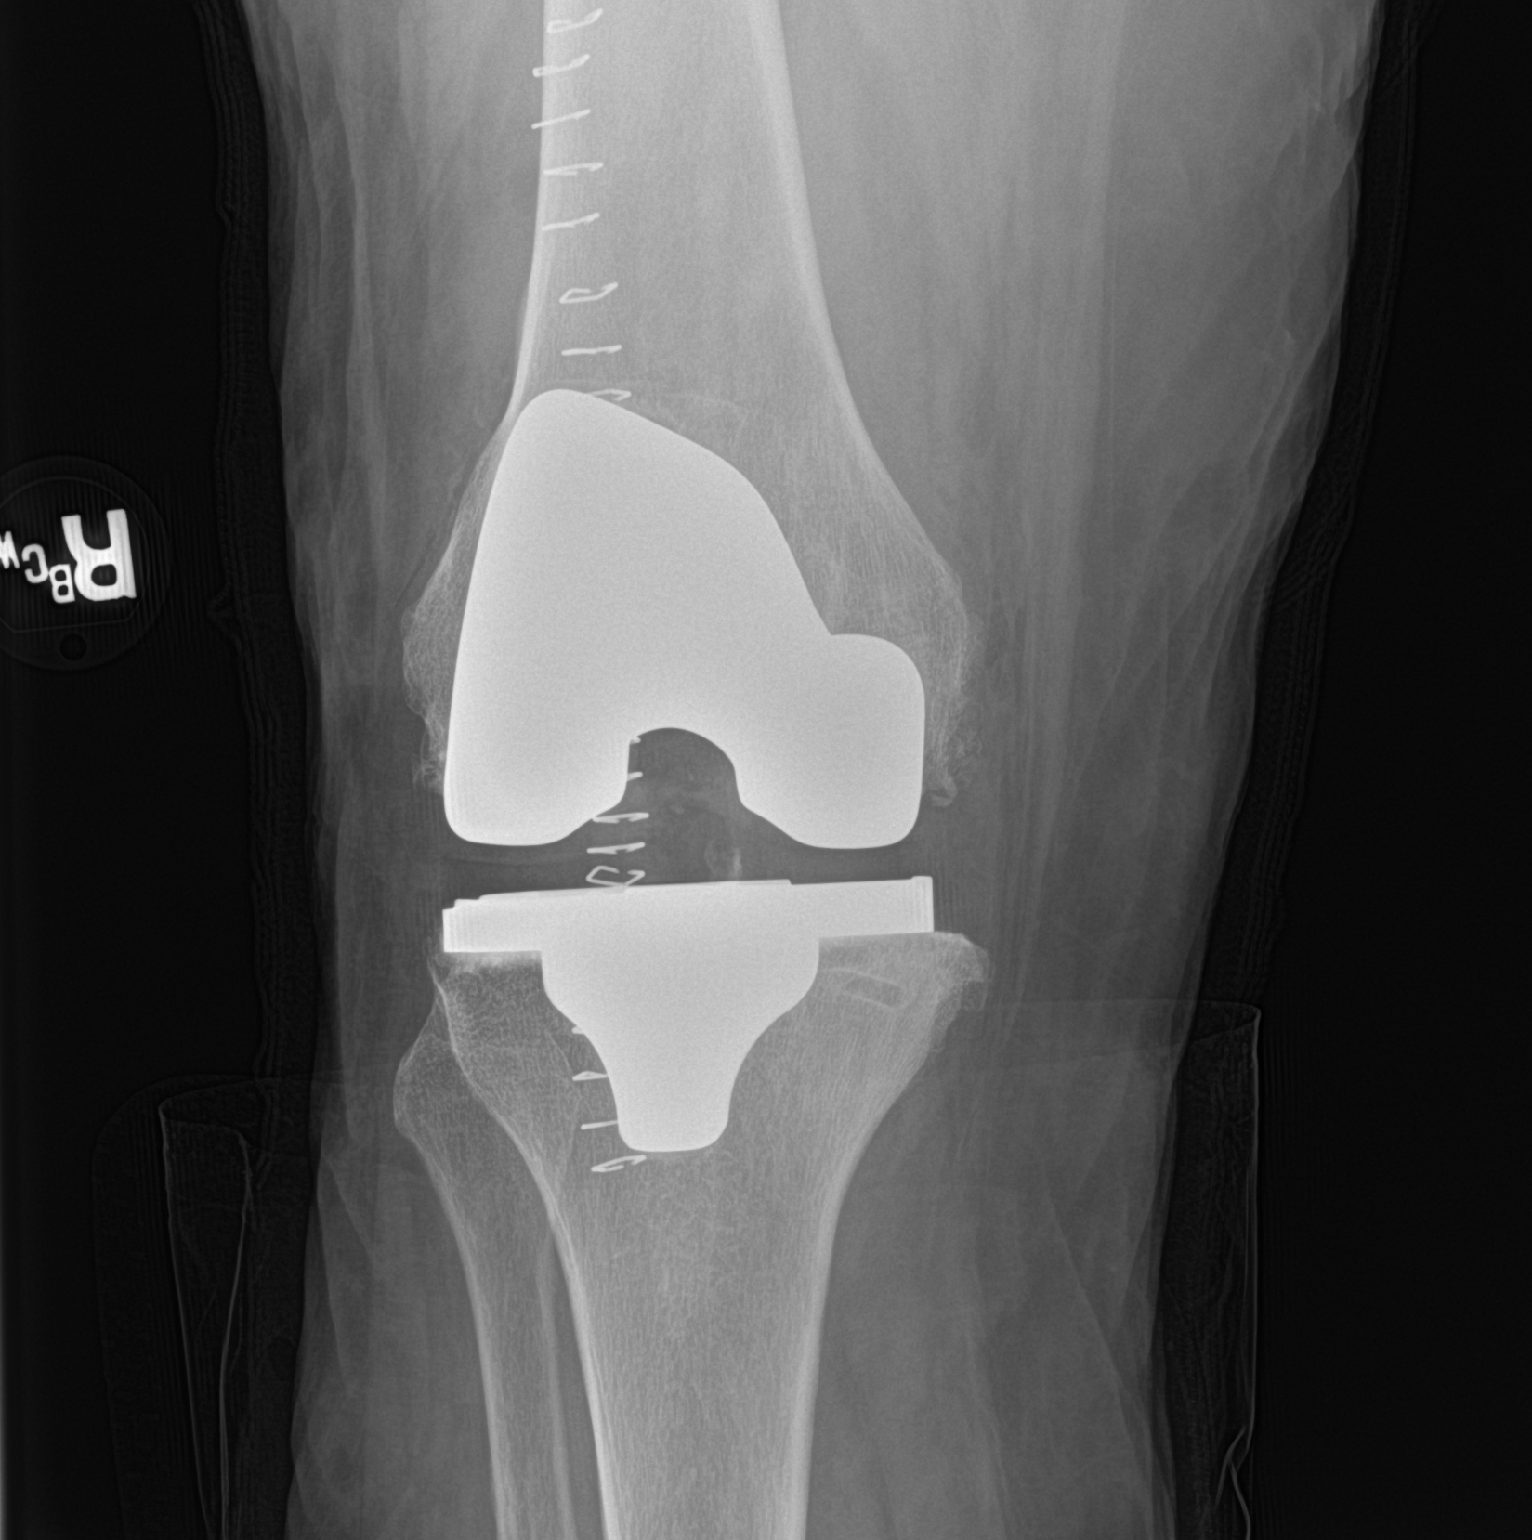

[knee lat]
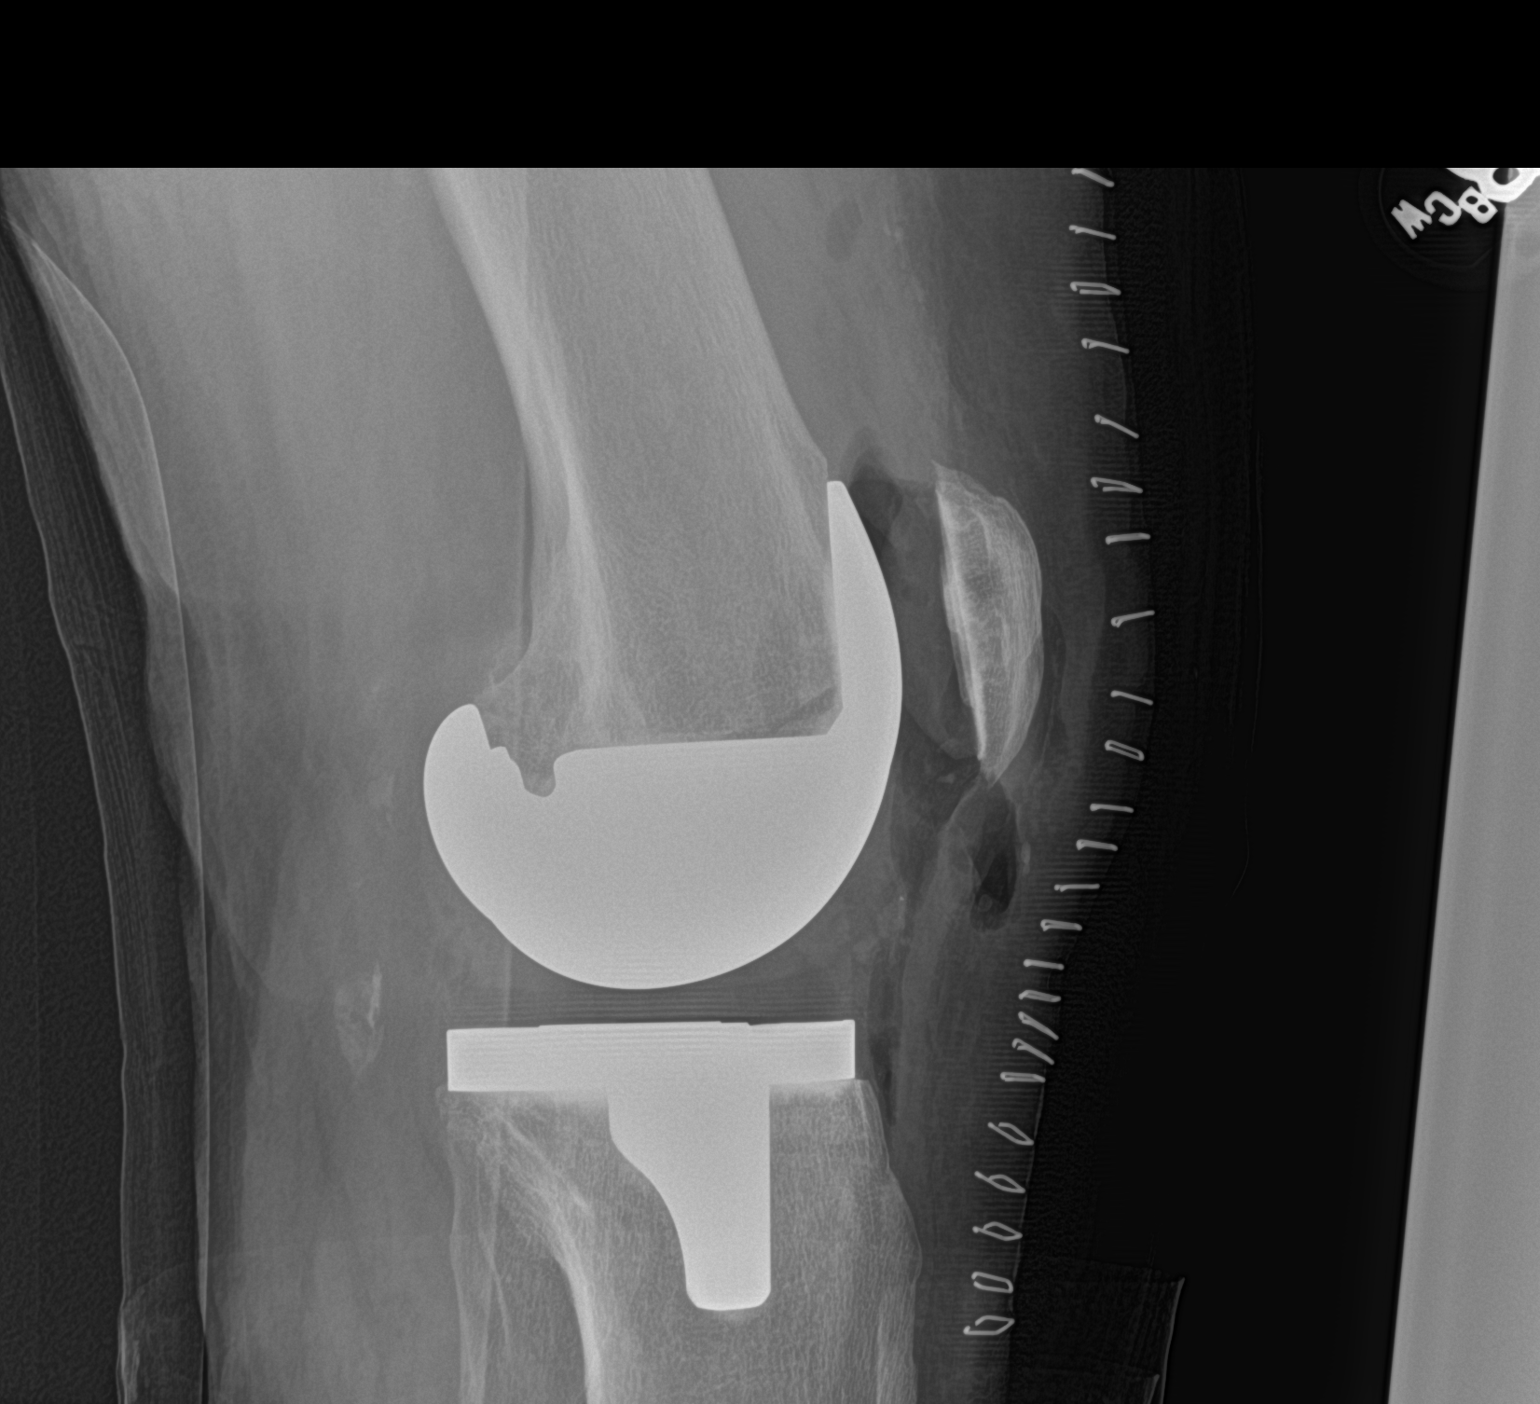

[2 of 2 positions shown; findings below may reference images not displayed]

FINDINGS: Status post right knee replacement with normal alignment. No
fracture. Gas in the joint space and soft tissues.
IMPRESSION: Status post right knee replacement with expected postsurgical change

## 2019-02-02 DIAGNOSIS — E669 Obesity, unspecified: Secondary | ICD-10-CM | POA: Diagnosis not present

## 2019-02-02 DIAGNOSIS — Z8249 Family history of ischemic heart disease and other diseases of the circulatory system: Secondary | ICD-10-CM | POA: Diagnosis not present

## 2019-02-02 DIAGNOSIS — Z823 Family history of stroke: Secondary | ICD-10-CM | POA: Diagnosis not present

## 2019-02-02 DIAGNOSIS — Z809 Family history of malignant neoplasm, unspecified: Secondary | ICD-10-CM | POA: Diagnosis not present

## 2019-02-02 DIAGNOSIS — I10 Essential (primary) hypertension: Secondary | ICD-10-CM | POA: Diagnosis not present

## 2019-02-02 DIAGNOSIS — E785 Hyperlipidemia, unspecified: Secondary | ICD-10-CM | POA: Diagnosis not present

## 2019-02-02 DIAGNOSIS — Z8542 Personal history of malignant neoplasm of other parts of uterus: Secondary | ICD-10-CM | POA: Diagnosis not present

## 2019-02-02 DIAGNOSIS — Z833 Family history of diabetes mellitus: Secondary | ICD-10-CM | POA: Diagnosis not present

## 2019-02-02 DIAGNOSIS — Z803 Family history of malignant neoplasm of breast: Secondary | ICD-10-CM | POA: Diagnosis not present

## 2019-02-09 ENCOUNTER — Other Ambulatory Visit: Payer: Self-pay | Admitting: Internal Medicine

## 2019-02-12 ENCOUNTER — Other Ambulatory Visit: Payer: Self-pay | Admitting: Internal Medicine

## 2019-02-12 NOTE — Telephone Encounter (Signed)
Done 8/10 Dr Rebeca Alert. [facility]. Not printed

## 2019-05-10 ENCOUNTER — Other Ambulatory Visit: Payer: Self-pay | Admitting: Pharmacist

## 2019-05-10 DIAGNOSIS — I10 Essential (primary) hypertension: Secondary | ICD-10-CM

## 2019-05-10 MED ORDER — LOSARTAN POTASSIUM 100 MG PO TABS: 100.0000 mg | ORAL_TABLET | Freq: Every day | ORAL | 3 refills | Status: AC

## 2019-05-10 NOTE — Progress Notes (Signed)
Patient requested prescription for losartan 100 mg 1 tablet daily (patient reports previously taking 50 mg 2 tablets daily due to pharmacy being out of stock of 100 mg last month). Sent updated prescription for 100 mg daily.

## 2019-12-24 ENCOUNTER — Other Ambulatory Visit: Payer: Self-pay | Admitting: Internal Medicine

## 2019-12-24 ENCOUNTER — Other Ambulatory Visit: Payer: Self-pay | Admitting: Family

## 2019-12-24 DIAGNOSIS — Z1231 Encounter for screening mammogram for malignant neoplasm of breast: Secondary | ICD-10-CM

## 2020-01-14 ENCOUNTER — Ambulatory Visit
Admission: RE | Admit: 2020-01-14 | Discharge: 2020-01-14 | Disposition: A | Discharge status: Home / Self Care | Payer: Medicare Other | Source: Ambulatory Visit | Attending: Family | Admitting: Family

## 2020-01-14 ENCOUNTER — Other Ambulatory Visit: Payer: Self-pay

## 2020-01-14 DIAGNOSIS — Z1231 Encounter for screening mammogram for malignant neoplasm of breast: Secondary | ICD-10-CM

## 2020-02-08 ENCOUNTER — Other Ambulatory Visit: Payer: Self-pay | Admitting: Internal Medicine

## 2020-02-08 DIAGNOSIS — I10 Essential (primary) hypertension: Secondary | ICD-10-CM

## 2020-02-08 DIAGNOSIS — E785 Hyperlipidemia, unspecified: Secondary | ICD-10-CM

## 2020-02-10 NOTE — Telephone Encounter (Signed)
No longer [facility] pt, transferred care to [facility].

## 2020-04-18 ENCOUNTER — Ambulatory Visit: Payer: Self-pay

## 2020-05-14 ENCOUNTER — Other Ambulatory Visit: Payer: Self-pay | Admitting: Internal Medicine

## 2020-05-14 DIAGNOSIS — I10 Essential (primary) hypertension: Secondary | ICD-10-CM

## 2021-06-01 ENCOUNTER — Other Ambulatory Visit: Payer: Self-pay | Admitting: Student

## 2021-06-01 DIAGNOSIS — Z1231 Encounter for screening mammogram for malignant neoplasm of breast: Secondary | ICD-10-CM

## 2021-07-28 ENCOUNTER — Other Ambulatory Visit: Payer: Self-pay

## 2021-07-28 ENCOUNTER — Ambulatory Visit
Admission: RE | Admit: 2021-07-28 | Discharge: 2021-07-28 | Disposition: A | Discharge status: Home / Self Care | Payer: Medicare Other | Source: Ambulatory Visit | Attending: Student | Admitting: Student

## 2021-07-28 ENCOUNTER — Ambulatory Visit: Payer: Medicare Other

## 2021-07-28 DIAGNOSIS — Z1231 Encounter for screening mammogram for malignant neoplasm of breast: Secondary | ICD-10-CM

## 2022-01-24 ENCOUNTER — Encounter: Payer: Self-pay | Admitting: Internal Medicine

## 2023-08-21 ENCOUNTER — Other Ambulatory Visit: Payer: Self-pay | Admitting: Family

## 2023-08-21 ENCOUNTER — Ambulatory Visit
Admission: RE | Admit: 2023-08-21 | Discharge: 2023-08-21 | Disposition: A | Discharge status: Home / Self Care | Payer: Medicare Other | Source: Ambulatory Visit | Attending: Family | Admitting: Family

## 2023-08-21 DIAGNOSIS — M1711 Unilateral primary osteoarthritis, right knee: Secondary | ICD-10-CM

## 2024-02-14 ENCOUNTER — Other Ambulatory Visit: Payer: Self-pay | Admitting: Nurse Practitioner

## 2024-02-14 ENCOUNTER — Ambulatory Visit
Admission: RE | Admit: 2024-02-14 | Discharge: 2024-02-14 | Disposition: A | Discharge status: Home / Self Care | Source: Ambulatory Visit | Attending: Nurse Practitioner | Admitting: Nurse Practitioner

## 2024-02-14 DIAGNOSIS — M25551 Pain in right hip: Secondary | ICD-10-CM

## 2024-06-10 ENCOUNTER — Encounter: Payer: Self-pay | Admitting: Orthopaedic Surgery

## 2024-06-10 ENCOUNTER — Other Ambulatory Visit: Payer: Self-pay

## 2024-06-10 ENCOUNTER — Ambulatory Visit: Admitting: Orthopaedic Surgery

## 2024-06-10 DIAGNOSIS — Z96651 Presence of right artificial knee joint: Secondary | ICD-10-CM

## 2024-06-10 NOTE — Progress Notes (Signed)
 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  She says her right knee has been hurting for several months now.  She actually has x-rays of her right hip and pelvis on the canopy system as well.  She is walking without assistive device.  Examination of both knees today shows no effusion.  The right knee moves smoothly and fluidly and she has no pain with exam today.  She is walking with a limp and she reports right groin pain.  I did gently internally and externally rotate her right hip and she does have pain in the groin with the right hip and some of the trochanteric area.  The left hip moves smoothly.  X-rays of her right knee today show well-seated right total knee arthroplasty with no complicating features.  I did review the x-rays of her pelvis and her right hip and she does have significant arthritic changes in the right hip and some in the left hip as well.  I do feel that her right knee pain is really directly associated with the right hip.  I would like to send her to Dr. Burnetta for an intra-articular steroid injection or ultrasound of the right hip joint and then can see her again in about 4 weeks to see if that has helped her.  She should also try to use a cane or opposite hand to offload that hip as well.  All questions and concerns were asked and addressed.  She agrees with this treatment plan.

## 2024-06-19 ENCOUNTER — Encounter: Payer: Self-pay | Admitting: Sports Medicine

## 2024-06-19 ENCOUNTER — Other Ambulatory Visit: Payer: Self-pay

## 2024-06-19 ENCOUNTER — Ambulatory Visit: Admitting: Sports Medicine

## 2024-06-19 DIAGNOSIS — M25551 Pain in right hip: Secondary | ICD-10-CM | POA: Diagnosis not present

## 2024-06-19 DIAGNOSIS — G8929 Other chronic pain: Secondary | ICD-10-CM

## 2024-06-19 DIAGNOSIS — M160 Bilateral primary osteoarthritis of hip: Secondary | ICD-10-CM | POA: Diagnosis not present

## 2024-06-19 MED ORDER — LIDOCAINE HCL 1 % IJ SOLN
4.0000 mL | INTRAMUSCULAR | Status: AC | PRN
  Administered 2024-06-19: 10:00:00 4 mL

## 2024-06-19 MED ORDER — METHYLPREDNISOLONE ACETATE 40 MG/ML IJ SUSP
80.0000 mg | INTRAMUSCULAR | Status: AC | PRN
  Administered 2024-06-19: 10:00:00 80 mg via INTRA_ARTICULAR

## 2024-06-19 NOTE — Progress Notes (Signed)
 Office & Procedure Note  Patient: Sherri Delgado             Date of Birth: [birth year]           MRN: [id number]             Visit Date: 06/19/2024  HPI: [name] is a very pleasant 78 year old female who presents for evaluation of right > left hip pain.  She is a patient of Dr. Damian who has had her knees replaced.  She was sent here for further evaluation and consideration of hip injection.  She also has some questions regarding long-term treatment of this as well as possible hip replacement and other options that may alleviate her pain.  She does not like to take medication for this.  She has done physical therapy in the past but not interested in this currently.  PE:  -Right hip: There is limited endrange internal and external range of motion.  Positive FADIR test, positive Stinchfield test.  Imaging:  *I did review her previous hip x-rays, though shows at least moderate-severe osteoarthritis of the right hip as well as mild to moderate of the left hip.  Narrative & Impression  CLINICAL DATA:  Right hip and groin pain.  No known injury.   EXAM: DG HIP (WITH OR WITHOUT PELVIS) 2-3V RIGHT   COMPARISON:  None Available.   FINDINGS: Bilateral hip osteoarthritis with joint space narrowing, subchondral cystic change and sclerosis. Osteophytes are mild the right, mild-to-moderate on the left. No evidence of fracture, erosion, or avascular necrosis. No focal bone lesion by radiograph. Multiple surgical clips in the pelvis. Mild degenerative change of both sacroiliac joints.   IMPRESSION: Mild to moderate bilateral hip osteoarthritis, left greater than right.     Electronically Signed   By: Andrea Gasman M.D.   On: 02/20/2024 10:31    Visit Diagnoses:  1. Pain in right hip    Procedures:  Large Joint Inj: R hip joint on 06/19/2024 10:21 AM Indications: pain Details: 22 G 3.5 in needle, ultrasound-guided anterior approach Medications: 4 mL lidocaine  1 %; 80 mg  methylPREDNISolone  acetate 40 MG/ML Outcome: tolerated well, no immediate complications  Procedure: US -guided intra-articular hip injection, Right After discussion on risks/benefits/indications and informed verbal consent was obtained, a timeout was performed. Patient was lying supine on exam table. The hip was cleaned with betadine and alcohol swabs. Then utilizing ultrasound guidance, the patient's femoral head and neck junction was identified and subsequently injected with 4:2 lidocaine :depomedrol via an in-plane approach with ultrasound visualization of the injectate administered into the hip joint. Patient tolerated procedure well without immediate complications.  Procedure, treatment alternatives, risks and benefits explained, specific risks discussed. Consent was given by the patient. Immediately prior to procedure a time out was called to verify the correct patient, procedure, equipment, support staff and site/side marked as required. Patient was prepped and draped in the usual sterile fashion.     Plan:   - I did review Sherri Delgado's hip x-rays and we discussed them today -she does have rather advancing right hip and to a lesser degree left hip arthritic change.  - Through shared decision making did proceed with ultrasound-guided right intra-articular hip injection.  We did discuss the role for both diagnostic and therapeutic benefit for her.  She is not interested in medications, but would like more permanent options.  We discussed evaluating the longevity of the hip injection, if this is very beneficial for many months, can consider infrequent injections.  If this only relieves her pain partially or she receives short-term benefit, she may be a candidate for hip replacement.  She will follow-up with Dr. Vernetta in 1 month to evaluate and update her progress. - May use ice/heat and/or Tylenol  for any postinjection pain.  -Of note, after about 5-10 minutes from hip injection, she had significant  relief in her pain and much more range of motion following this.  Lonell Sprang, DO Primary Care Sports Medicine Physician  [facility]  This note was dictated using Dragon naturally speaking software and may contain errors in syntax, spelling, or content which have not been identified prior to signing this note.

## 2024-07-10 ENCOUNTER — Encounter: Payer: Self-pay | Admitting: Orthopaedic Surgery

## 2024-07-10 ENCOUNTER — Ambulatory Visit: Admitting: Orthopaedic Surgery

## 2024-07-10 DIAGNOSIS — G8929 Other chronic pain: Secondary | ICD-10-CM

## 2024-07-10 DIAGNOSIS — M25551 Pain in right hip: Secondary | ICD-10-CM | POA: Diagnosis not present

## 2024-07-10 DIAGNOSIS — M1611 Unilateral primary osteoarthritis, right hip: Secondary | ICD-10-CM

## 2024-07-10 NOTE — Progress Notes (Signed)
 The patient is a 79 year old female well-known to us .  She has well-documented severe arthritis in her right hip.  She does walk with a cane.  She has had both her knees replaced.  We recently sent her to Dr. Burnetta for an intra-articular steroid injection in her right hip joint under ultrasound.  She said that injection helped great but only for about a week.  She says she does not like the way she walks.  At this point her right hip pain is daily and it is detrimentally affecting her mobility, her quality of life and her actives daily living.  Previous x-rays of her right hip were seen again today from a few months ago and it does show end-stage arthritis of the right hip with joint space narrowing, sclerotic and cystic changes as well as osteophytes around the right hip.  On exam her left hip moves smoothly and fluidly with no pain in the groin and no blocks or rotation.  The right hip has stiffness with internal and external rotation with significant pain in the groin with rotation.  We had a long and thorough discussion about hip replacement surgery.  She does wish to proceed with hip replacement given her continued right hip pain combined with the detrimental effect this is having on her life.  She denies any chest pain or shortness of breath.  She has all of her medical issues under control in terms of high blood pressure.  She is not on blood thinning medications and not on diabetic medications.  She sees her primary care provider on a regular basis.  I showed her hip replacement model and gave her handout about hip replacement surgery.  We discussed the risks and benefits of the surgery and what to expect from an intraoperative and postoperative standpoint.  She does live alone but says she can arrange having people help her out.  We will work on getting her scheduled for right total hip replacement.

## 2024-09-12 ENCOUNTER — Encounter: Admitting: Orthopaedic Surgery
